# Patient Record
Sex: Female | Born: 1965 | Race: Black or African American | Hispanic: No | Marital: Single | Smoking: Former smoker
Health system: Southern US, Community
[De-identification: ages and names within clinical notes are randomized; demographics above are authoritative.]

## PROBLEM LIST (undated history)

## (undated) DIAGNOSIS — Z789 Other specified health status: Secondary | ICD-10-CM

## (undated) DIAGNOSIS — Z720 Tobacco use: Secondary | ICD-10-CM

## (undated) DIAGNOSIS — F1090 Alcohol use, unspecified, uncomplicated: Secondary | ICD-10-CM

## (undated) HISTORY — PX: BREAST CYST ASPIRATION: SHX578

---

## 2005-08-02 ENCOUNTER — Emergency Department: Payer: Self-pay | Admitting: Emergency Medicine

## 2005-11-09 ENCOUNTER — Emergency Department: Payer: Self-pay | Admitting: Emergency Medicine

## 2007-08-14 ENCOUNTER — Emergency Department: Payer: Self-pay | Admitting: Emergency Medicine

## 2007-10-08 HISTORY — PX: BREAST BIOPSY: SHX20

## 2008-04-11 ENCOUNTER — Emergency Department: Payer: Self-pay | Admitting: Emergency Medicine

## 2008-04-12 ENCOUNTER — Ambulatory Visit: Payer: Self-pay | Admitting: Emergency Medicine

## 2008-04-12 ENCOUNTER — Ambulatory Visit: Payer: Self-pay | Admitting: Obstetrics and Gynecology

## 2008-04-13 ENCOUNTER — Ambulatory Visit: Payer: Self-pay | Admitting: Emergency Medicine

## 2008-11-04 ENCOUNTER — Ambulatory Visit: Payer: Self-pay | Admitting: Obstetrics and Gynecology

## 2010-11-27 ENCOUNTER — Emergency Department: Payer: Self-pay | Admitting: Emergency Medicine

## 2011-06-18 ENCOUNTER — Emergency Department: Payer: Self-pay | Admitting: Emergency Medicine

## 2011-10-08 HISTORY — PX: BREAST BIOPSY: SHX20

## 2012-02-27 ENCOUNTER — Emergency Department: Payer: Self-pay | Admitting: Emergency Medicine

## 2012-03-19 ENCOUNTER — Ambulatory Visit: Payer: Self-pay | Admitting: Obstetrics and Gynecology

## 2012-04-21 ENCOUNTER — Ambulatory Visit: Payer: Self-pay | Admitting: Surgery

## 2012-04-23 LAB — PATHOLOGY REPORT

## 2012-08-07 ENCOUNTER — Emergency Department: Payer: Self-pay | Admitting: Emergency Medicine

## 2015-01-24 NOTE — Op Note (Signed)
PATIENT NAME:  [name], Jeanette A MR#:  [id number] DATE OF BIRTH:  [birth year]  DATE OF PROCEDURE:  04/21/2012  PREOPERATIVE DIAGNOSIS: Left subareolar abscess.   POSTOPERATIVE DIAGNOSIS: Left subareolar abscess.   PROCEDURE PERFORMED: Left breast ductal excision.   SURGEON: Claude MangesWilliam F. Jinnie Onley, M.D.   ANESTHESIA: General.   SPECIMEN: Left subareolar abscess cavity and ducts.   PROCEDURE IN DETAIL: The patient was placed supine on the operating room table and prepped and draped in the usual sterile fashion. An elliptical incision was made at the edge of the areola complex on the left breast to incorporate the draining sinus tract and take a sliver of areolar tissue and a small amount of skin just lateral to the areola. This was carried down through the skin into the subcutaneous tissue and the abscess cavity (which was lined with granulation tissue) and all of the ducts of the left breast were excised with electrocautery. A little bit of inspissated milk was then expressed through the nipple. The wound was irrigated with warm normal saline. This was suctioned clear and a subdermal interrupted 3-0 Monocryl closure was performed and the skin was reapproximated with a running subcuticular 5-0 Monocryl and suture strips. A compressive dressing was applied with fluffs and the patient's own athletic bra. The patient tolerated the procedure well. There were no complications. ____________________________ Claude MangesWilliam F. Shaune Westfall, MD wfm:slb D: 04/21/2012 09:43:17 ET    T: 04/21/2012 12:28:29 ET       JOB#: [id number] cc: Claude MangesWilliam F. Mannie Ohlin, MD, <Dictator> Claude MangesWILLIAM F Mela Perham MD ELECTRONICALLY SIGNED 04/21/2012 18:20

## 2015-02-07 ENCOUNTER — Other Ambulatory Visit: Payer: Self-pay | Admitting: Obstetrics and Gynecology

## 2015-02-07 DIAGNOSIS — Z1231 Encounter for screening mammogram for malignant neoplasm of breast: Secondary | ICD-10-CM

## 2015-06-09 ENCOUNTER — Other Ambulatory Visit: Payer: Self-pay | Admitting: Obstetrics and Gynecology

## 2015-06-09 DIAGNOSIS — N61 Mastitis without abscess: Secondary | ICD-10-CM

## 2015-06-16 ENCOUNTER — Ambulatory Visit
Admission: RE | Admit: 2015-06-16 | Discharge: 2015-06-16 | Disposition: A | Discharge status: Home / Self Care | Payer: PRIVATE HEALTH INSURANCE | Source: Ambulatory Visit | Attending: Obstetrics and Gynecology | Admitting: Obstetrics and Gynecology

## 2015-06-16 DIAGNOSIS — N61 Inflammatory disorders of breast: Secondary | ICD-10-CM | POA: Insufficient documentation

## 2015-11-30 ENCOUNTER — Observation Stay
Admission: EM | Admit: 2015-11-30 | Discharge: 2015-12-01 | Disposition: A | Discharge status: Home / Self Care | Payer: PRIVATE HEALTH INSURANCE | Attending: Internal Medicine | Admitting: Internal Medicine

## 2015-11-30 ENCOUNTER — Emergency Department: Payer: PRIVATE HEALTH INSURANCE

## 2015-11-30 ENCOUNTER — Encounter: Payer: Self-pay | Admitting: Emergency Medicine

## 2015-11-30 DIAGNOSIS — R300 Dysuria: Secondary | ICD-10-CM | POA: Diagnosis not present

## 2015-11-30 DIAGNOSIS — R109 Unspecified abdominal pain: Secondary | ICD-10-CM | POA: Diagnosis not present

## 2015-11-30 DIAGNOSIS — R509 Fever, unspecified: Secondary | ICD-10-CM | POA: Diagnosis not present

## 2015-11-30 DIAGNOSIS — N136 Pyonephrosis: Secondary | ICD-10-CM | POA: Diagnosis not present

## 2015-11-30 DIAGNOSIS — E876 Hypokalemia: Secondary | ICD-10-CM | POA: Diagnosis not present

## 2015-11-30 DIAGNOSIS — Z882 Allergy status to sulfonamides status: Secondary | ICD-10-CM | POA: Insufficient documentation

## 2015-11-30 DIAGNOSIS — B9620 Unspecified Escherichia coli [E. coli] as the cause of diseases classified elsewhere: Secondary | ICD-10-CM | POA: Insufficient documentation

## 2015-11-30 DIAGNOSIS — N390 Urinary tract infection, site not specified: Secondary | ICD-10-CM | POA: Insufficient documentation

## 2015-11-30 DIAGNOSIS — N23 Unspecified renal colic: Secondary | ICD-10-CM

## 2015-11-30 DIAGNOSIS — F17200 Nicotine dependence, unspecified, uncomplicated: Secondary | ICD-10-CM | POA: Diagnosis not present

## 2015-11-30 DIAGNOSIS — N3090 Cystitis, unspecified without hematuria: Secondary | ICD-10-CM | POA: Diagnosis present

## 2015-11-30 DIAGNOSIS — Z880 Allergy status to penicillin: Secondary | ICD-10-CM | POA: Insufficient documentation

## 2015-11-30 DIAGNOSIS — Z1629 Resistance to other single specified antibiotic: Secondary | ICD-10-CM | POA: Insufficient documentation

## 2015-11-30 DIAGNOSIS — Z881 Allergy status to other antibiotic agents status: Secondary | ICD-10-CM | POA: Diagnosis not present

## 2015-11-30 DIAGNOSIS — Z833 Family history of diabetes mellitus: Secondary | ICD-10-CM | POA: Diagnosis not present

## 2015-11-30 DIAGNOSIS — Z1611 Resistance to penicillins: Secondary | ICD-10-CM | POA: Diagnosis not present

## 2015-11-30 DIAGNOSIS — N200 Calculus of kidney: Secondary | ICD-10-CM

## 2015-11-30 LAB — COMPREHENSIVE METABOLIC PANEL
ALK PHOS: 60 U/L (ref 38–126)
ALT: 25 U/L (ref 14–54)
AST: 22 U/L (ref 15–41)
Albumin: 4 g/dL (ref 3.5–5.0)
Anion gap: 10 (ref 5–15)
BILIRUBIN TOTAL: 0.6 mg/dL (ref 0.3–1.2)
BUN: 8 mg/dL (ref 6–20)
CHLORIDE: 102 mmol/L (ref 101–111)
CO2: 28 mmol/L (ref 22–32)
Calcium: 9.1 mg/dL (ref 8.9–10.3)
Creatinine, Ser: 0.87 mg/dL (ref 0.44–1.00)
Glucose, Bld: 114 mg/dL — ABNORMAL HIGH (ref 65–99)
Potassium: 3 mmol/L — ABNORMAL LOW (ref 3.5–5.1)
Sodium: 140 mmol/L (ref 135–145)
TOTAL PROTEIN: 7.5 g/dL (ref 6.5–8.1)

## 2015-11-30 LAB — CBC
HEMATOCRIT: 37.6 % (ref 35.0–47.0)
Hemoglobin: 12.7 g/dL (ref 12.0–16.0)
MCH: 29.9 pg (ref 26.0–34.0)
MCHC: 33.7 g/dL (ref 32.0–36.0)
MCV: 88.9 fL (ref 80.0–100.0)
Platelets: 219 10*3/uL (ref 150–440)
RBC: 4.23 MIL/uL (ref 3.80–5.20)
RDW: 13.5 % (ref 11.5–14.5)
WBC: 13.7 10*3/uL — AB (ref 3.6–11.0)

## 2015-11-30 LAB — URINALYSIS COMPLETE WITH MICROSCOPIC (ARMC ONLY)
Bilirubin Urine: NEGATIVE
Glucose, UA: NEGATIVE mg/dL
KETONES UR: NEGATIVE mg/dL
NITRITE: NEGATIVE
PROTEIN: 100 mg/dL — AB
SPECIFIC GRAVITY, URINE: 1.006 (ref 1.005–1.030)
pH: 7 (ref 5.0–8.0)

## 2015-11-30 LAB — LIPASE, BLOOD: LIPASE: 22 U/L (ref 11–51)

## 2015-11-30 LAB — PREGNANCY, URINE: Preg Test, Ur: NEGATIVE

## 2015-11-30 LAB — MAGNESIUM: MAGNESIUM: 2.1 mg/dL (ref 1.7–2.4)

## 2015-11-30 MED ORDER — HYDROMORPHONE HCL 1 MG/ML IJ SOLN
INTRAMUSCULAR | Status: AC
  Administered 2015-11-30: 1 mg via INTRAVENOUS
  Filled 2015-11-30: qty 1

## 2015-11-30 MED ORDER — DEXTROSE 5 % IV SOLN
1.0000 g | Freq: Once | INTRAVENOUS | Status: AC
  Administered 2015-11-30: 1 g via INTRAVENOUS
  Filled 2015-11-30: qty 10

## 2015-11-30 MED ORDER — ALBUTEROL SULFATE (2.5 MG/3ML) 0.083% IN NEBU: 2.5000 mg | INHALATION_SOLUTION | RESPIRATORY_TRACT | Status: DC | PRN

## 2015-11-30 MED ORDER — ACETAMINOPHEN 650 MG RE SUPP: 650.0000 mg | Freq: Four times a day (QID) | RECTAL | Status: DC | PRN

## 2015-11-30 MED ORDER — PANTOPRAZOLE SODIUM 20 MG PO TBEC
20.0000 mg | DELAYED_RELEASE_TABLET | Freq: Every day | ORAL | Status: DC
  Filled 2015-11-30: qty 1

## 2015-11-30 MED ORDER — POTASSIUM CHLORIDE CRYS ER 20 MEQ PO TBCR
60.0000 meq | EXTENDED_RELEASE_TABLET | Freq: Once | ORAL | Status: AC
  Administered 2015-11-30: 60 meq via ORAL
  Filled 2015-11-30: qty 3

## 2015-11-30 MED ORDER — DEXTROSE 5 % IV SOLN
INTRAVENOUS | Status: AC
  Filled 2015-11-30: qty 10

## 2015-11-30 MED ORDER — CIPROFLOXACIN IN D5W 400 MG/200ML IV SOLN
400.0000 mg | Freq: Two times a day (BID) | INTRAVENOUS | Status: DC
  Administered 2015-11-30: 400 mg via INTRAVENOUS
  Filled 2015-11-30 (×3): qty 200

## 2015-11-30 MED ORDER — ACETAMINOPHEN 325 MG PO TABS: 650.0000 mg | ORAL_TABLET | Freq: Four times a day (QID) | ORAL | Status: DC | PRN

## 2015-11-30 MED ORDER — HYDROMORPHONE HCL 1 MG/ML IJ SOLN
1.0000 mg | Freq: Once | INTRAMUSCULAR | Status: AC
  Administered 2015-11-30: 1 mg via INTRAVENOUS

## 2015-11-30 MED ORDER — PHENTERMINE HCL 37.5 MG PO TABS: 37.5000 mg | ORAL_TABLET | Freq: Every morning | ORAL | Status: DC

## 2015-11-30 MED ORDER — NICOTINE 14 MG/24HR TD PT24
14.0000 mg | MEDICATED_PATCH | Freq: Every day | TRANSDERMAL | Status: DC
  Administered 2015-11-30: 14 mg via TRANSDERMAL
  Filled 2015-11-30: qty 1

## 2015-11-30 MED ORDER — KETOROLAC TROMETHAMINE 30 MG/ML IJ SOLN
30.0000 mg | Freq: Once | INTRAMUSCULAR | Status: AC
  Administered 2015-11-30: 30 mg via INTRAVENOUS

## 2015-11-30 MED ORDER — SODIUM CHLORIDE 0.9 % IV SOLN
INTRAVENOUS | Status: DC
Start: 2015-11-30 — End: 2015-12-01
  Administered 2015-11-30 – 2015-12-01 (×2): via INTRAVENOUS

## 2015-11-30 MED ORDER — ONDANSETRON HCL 4 MG PO TABS: 4.0000 mg | ORAL_TABLET | Freq: Four times a day (QID) | ORAL | Status: DC | PRN

## 2015-11-30 MED ORDER — ONDANSETRON HCL 4 MG/2ML IJ SOLN: 4.0000 mg | Freq: Four times a day (QID) | INTRAMUSCULAR | Status: DC | PRN

## 2015-11-30 MED ORDER — KETOROLAC TROMETHAMINE 30 MG/ML IJ SOLN
INTRAMUSCULAR | Status: AC
  Administered 2015-11-30: 30 mg via INTRAVENOUS
  Filled 2015-11-30: qty 1

## 2015-11-30 MED ORDER — PANTOPRAZOLE SODIUM 40 MG PO TBEC
40.0000 mg | DELAYED_RELEASE_TABLET | Freq: Every day | ORAL | Status: DC
  Administered 2015-11-30: 40 mg via ORAL
  Filled 2015-11-30: qty 1

## 2015-11-30 MED ORDER — HEPARIN SODIUM (PORCINE) 5000 UNIT/ML IJ SOLN
5000.0000 [IU] | Freq: Three times a day (TID) | INTRAMUSCULAR | Status: DC
  Administered 2015-11-30 – 2015-12-01 (×2): 5000 [IU] via SUBCUTANEOUS
  Filled 2015-11-30 (×2): qty 1

## 2015-11-30 NOTE — ED Notes (Addendum)
Pt transported to room 220 via stretcher by ED Tech.

## 2015-11-30 NOTE — ED Provider Notes (Signed)
[facility] Emergency Department Provider Note     Time seen: ----------------------------------------- 5:22 PM on 11/30/2015 -----------------------------------------    I have reviewed the triage vital signs and the nursing notes.   HISTORY  Chief Complaint Flank Pain    HPI Jeanette Ball is a 50 y.o. female who presents ER for right flank pain since yesterday. Patient is a pain with urination, warts some blood in her urine after wiping. She's never had this happen before, nothing makes symptoms better or worse. She denies fevers chills or other complaints.   History reviewed. No pertinent past medical history.  There are no active problems to display for this patient.   Past Surgical History  Procedure Laterality Date  . Breast biopsy Left 2013    neg  . Breast biopsy Right 2009    neg  . Breast cyst aspiration Bilateral     Allergies Bactrim; Penicillins; and Sulfa antibiotics  Social History Social History  Substance Use Topics  . Smoking status: Current Every Day Smoker  . Smokeless tobacco: None  . Alcohol Use: Yes    Review of Systems Constitutional: Negative for fever. Eyes: Negative for visual changes. ENT: Negative for sore throat. Cardiovascular: Negative for chest pain. Respiratory: Negative for shortness of breath. Gastrointestinal: Positive right flank pain Genitourinary: Positive for dysuria and urinary hesitancy Musculoskeletal: Negative for back pain. Skin: Negative for rash. Neurological: Negative for headaches, focal weakness or numbness.  10-point ROS otherwise negative.  ____________________________________________   PHYSICAL EXAM:  VITAL SIGNS: ED Triage Vitals  Enc Vitals Group     BP 11/30/15 1603 125/73 mmHg     Pulse Rate 11/30/15 1603 111     Resp 11/30/15 1603 20     Temp 11/30/15 1603 98.9 F (37.2 C)     Temp Source 11/30/15 1603 Oral     SpO2 11/30/15 1603 97 %     Weight  11/30/15 1603 190 lb (86.183 kg)     Height 11/30/15 1603  (1.753 m)     Head Cir --      Peak Flow --      Pain Score 11/30/15 1603 10     Pain Loc --      Pain Edu? --      Excl. in GC? --     Constitutional: Alert and oriented. Well appearing and in no distress. Eyes: Conjunctivae are normal. PERRL. Normal extraocular movements. ENT   Head: Normocephalic and atraumatic.   Nose: No congestion/rhinnorhea.   Mouth/Throat: Mucous membranes are moist.   Neck: No stridor. Cardiovascular: Normal rate, regular rhythm. Normal and symmetric distal pulses are present in all extremities. No murmurs, rubs, or gallops. Respiratory: Normal respiratory effort without tachypnea nor retractions. Breath sounds are clear and equal bilaterally. No wheezes/rales/rhonchi. Gastrointestinal: Right flank tenderness, normal bowel sounds. Musculoskeletal: Nontender with normal range of motion in all extremities. No joint effusions.  No lower extremity tenderness nor edema. Neurologic:  Normal speech and language. No gross focal neurologic deficits are appreciated. Speech is normal. No gait instability. Skin:  Skin is warm, dry and intact. No rash noted. Psychiatric: Mood and affect are normal. Speech and behavior are normal. Patient exhibits appropriate insight and judgment. ____________________________________________  ED COURSE:  Pertinent labs & imaging results that were available during my care of the patient were reviewed by me and considered in my medical decision making (see chart for details). Patient is in no acute distress, will obtain CT imaging and basic labs.  ____________________________________________    LABS (pertinent positives/negatives)  Labs Reviewed  COMPREHENSIVE METABOLIC PANEL - Abnormal; Notable for the following:    Potassium 3.0 (*)    Glucose, Bld 114 (*)    All other components within normal limits  CBC - Abnormal; Notable for the following:    WBC 13.7  (*)    All other components within normal limits  URINALYSIS COMPLETEWITH MICROSCOPIC ([facility] ONLY) - Abnormal; Notable for the following:    Color, Urine YELLOW (*)    APPearance CLOUDY (*)    Hgb urine dipstick 3+ (*)    Protein, ur 100 (*)    Leukocytes, UA 3+ (*)    Bacteria, UA FEW (*)    Squamous Epithelial / LPF 0-5 (*)    All other components within normal limits  URINE CULTURE  LIPASE, BLOOD  PREGNANCY, URINE    RADIOLOGY Images were viewed by me  CT renal protocol IMPRESSION: 1. There is mild right hydronephrosis as well as right perinephric stranding and mild right ureteral dilation. Although not definitive, there is a small stone just above the ureterovesicular junction on the right that appears to reside in the distal right ureter. 2. No other acute finding. No intrarenal stones. ____________________________________________  FINAL ASSESSMENT AND PLAN  Renal colic, cystitis  Plan: Patient with labs and imaging as dictated above. I have discussed with Dr. Annabell Howells who is on-call for urology. Patient is feeling better, but she has findings concerning for infection associated with a renal stone. She has received IV Rocephin, a urine culture has been sent. The neurologist has requested admission for observation. I will discuss with the hospitalist for admission.   Emily Filbert, MD   Emily Filbert, MD 11/30/15 [phone]

## 2015-11-30 NOTE — H&P (Addendum)
[facility] [place] [facility]   PATIENT NAME: Jeanette Ball    MR#:  [id number]  DATE OF BIRTH:  [birth year]  DATE OF ADMISSION:  11/30/2015  PRIMARY CARE PHYSICIAN: No PCP Per Patient   REQUESTING/REFERRING PHYSICIAN: Emily Filbert, MD  CHIEF COMPLAINT:   Chief Complaint  Patient presents with  . Flank Pain   Right abdominal pain and flank pain 2 days. HISTORY OF PRESENT ILLNESS:  Jeanette Ball  is a 50 y.o. female with no past medical history. She presented in the ED with right-sided abdominal pain and flank pain 2 days. She had fever one time yesterday. She she denies any dysuria, hematuria or urinary frequency. Urinalysis show UTI. CAT scan of abdomen and pelvis show nephrolithiasis and hydronephrosis. Urologist to suggest observation the patient and may get procedure tomorrow.  PAST MEDICAL HISTORY:  History reviewed. No pertinent past medical history.  PAST SURGICAL HISTORY:   Past Surgical History  Procedure Laterality Date  . Breast biopsy Left 2013    neg  . Breast biopsy Right 2009    neg  . Breast cyst aspiration Bilateral     SOCIAL HISTORY:   Social History  Substance Use Topics  . Smoking status: Current Every Day Smoker -- 1.00 packs/day for 7 years  . Smokeless tobacco: Not on file  . Alcohol Use: Yes    FAMILY HISTORY:  No family history on file.  DRUG ALLERGIES:   Allergies  Allergen Reactions  . Bactrim [Sulfamethoxazole-Trimethoprim]   . Penicillins Other (See Comments)    Hallucination and Skin falling off.  PT hasn't had Pen since baby and was hospitalized for it.   . Sulfa Antibiotics     REVIEW OF SYSTEMS:  CONSTITUTIONAL: No fever, fatigue or weakness.  EYES: No blurred or double vision.  EARS, NOSE, AND THROAT: No tinnitus or ear pain.  RESPIRATORY: No cough, shortness of breath, wheezing or hemoptysis.  CARDIOVASCULAR: No chest pain, orthopnea, edema.  GASTROINTESTINAL: No nausea,  vomiting, diarrhea but has right-sided flank and abdominal pain.  GENITOURINARY: No dysuria, hematuria.  ENDOCRINE: No polyuria, nocturia,  HEMATOLOGY: No anemia, easy bruising or bleeding SKIN: No rash or lesion. MUSCULOSKELETAL: No joint pain or arthritis.   NEUROLOGIC: No tingling, numbness, weakness.  PSYCHIATRY: No anxiety or depression.   MEDICATIONS AT HOME:   Prior to Admission medications   Medication Sig Start Date End Date Taking? Authorizing Provider  pantoprazole (PROTONIX) 20 MG tablet Take 1 tablet by mouth daily. 11/21/15  Yes Historical Provider, MD  phentermine (ADIPEX-P) 37.5 MG tablet Take 37.5 mg by mouth every morning. 11/18/15  Yes Historical Provider, MD      VITAL SIGNS:  Blood pressure 129/84, pulse 88, temperature 98.9 F (37.2 C), temperature source Oral, resp. rate 16, height  (1.753 m), weight 86.183 kg (190 lb), SpO2 99 %.  PHYSICAL EXAMINATION:  GENERAL:  50 y.o.-year-old patient lying in the bed with no acute distress.  EYES: Pupils equal, round, reactive to light and accommodation. No scleral icterus. Extraocular muscles intact.  HEENT: Head atraumatic, normocephalic. Oropharynx and nasopharynx clear.  NECK:  Supple, no jugular venous distention. No thyroid enlargement, no tenderness.  LUNGS: Normal breath sounds bilaterally, no wheezing, rales,rhonchi or crepitation. No use of accessory muscles of respiration.  CARDIOVASCULAR: S1, S2 normal. No murmurs, rubs, or gallops.  ABDOMEN: Soft, nontender, nondistended. Bowel sounds present. No organomegaly or mass.  EXTREMITIES: No pedal edema, cyanosis, or clubbing.  NEUROLOGIC: Cranial nerves II  through XII are intact. Muscle strength 5/5 in all extremities. Sensation intact. Gait not checked.  PSYCHIATRIC: The patient is alert and oriented x 3.  SKIN: No obvious rash, lesion, or ulcer.   LABORATORY PANEL:   CBC  Recent Labs Lab 11/30/15 1609  WBC 13.7*  HGB 12.7  HCT 37.6  PLT 219    ------------------------------------------------------------------------------------------------------------------  Chemistries   Recent Labs Lab 11/30/15 1609  NA 140  K 3.0*  CL 102  CO2 28  GLUCOSE 114*  BUN 8  CREATININE 0.87  CALCIUM 9.1  AST 22  ALT 25  ALKPHOS 60  BILITOT 0.6   ------------------------------------------------------------------------------------------------------------------  Cardiac Enzymes No results for input(s): TROPONINI in the last 168 hours. ------------------------------------------------------------------------------------------------------------------  RADIOLOGY:  Ct Renal Stone Study  11/30/2015  CLINICAL DATA:  Right flank pain since yesterday. Dysuria. Possible hematuria. EXAM: CT ABDOMEN AND PELVIS WITHOUT CONTRAST TECHNIQUE: Multidetector CT imaging of the abdomen and pelvis was performed following the standard protocol without IV contrast. COMPARISON:  None. FINDINGS: Lung bases: Minimal atelectasis. Otherwise clear. Heart normal in size. Liver, spleen, gallbladder, pancreas, adrenal glands:  Normal. Kidneys, ureters, bladder: Kidneys are under rotated. There is mild right hydronephrosis with right perinephric and periureteral stranding. There is mild dilation of the right ureter. The distal ureter is difficult to follow to the ureterovesicular junction. There appears to be a small calculus in the distal ureter just above the ureterovesicular junction. There also multiple phleboliths in the pelvis. There are no intrarenal stones. No renal masses. Left renal collecting system ureter are unremarkable. No bladder mass or stone. Uterus and adnexa: Poorly defined partly calcified fibroid. Uterus normal in overall size. No adnexal masses. Lymph nodes:  No adenopathy. Ascites:  None. Gastrointestinal:  Unremarkable.  Normal appendix visualized. Musculoskeletal: Minor disc degenerative changes along the visualized thoracic and lumbar spine. Otherwise  unremarkable. IMPRESSION: 1. There is mild right hydronephrosis as well as right perinephric stranding and mild right ureteral dilation. Although not definitive, there is a small stone just above the ureterovesicular junction on the right that appears to reside in the distal right ureter. 2. No other acute finding.  No intrarenal stones. Electronically Signed   By: Amie Portland M.D.   On: 11/30/2015 18:58    EKG:   Orders placed or performed in visit on 11/27/10  . EKG 12-Lead    IMPRESSION AND PLAN:   Nephrolithiasis Hydronephrosis UTI, pyelonephritis Leukocytosis  The patient will be placed for observation. I will start Cipro IV twice a day, follow-up BC, urine culture and the urologist's consult. Start IV fluid support, Zofran when necessary.   hypokalemia.  Give potassium supplement and check magnesium level.  Tobacco abuse. Smoking cessation was counseled for 3 minutes. Nicotine patch.  All the records are reviewed and case discussed with ED provider. Management plans discussed with the patient, family and they are in agreement.  CODE STATUS: Full code  TOTAL TIME TAKING CARE OF THIS PATIENT: 48 minutes.    Shaune Pollack M.D on 11/30/2015 at 8:19 PM  Between 7am to 6pm - Pager - [phone]  After 6pm go to [email or web address] - password [email or web address] [facility]  [facility]  Office  [phone]  CC: Primary care physician; No PCP Per Patient

## 2015-11-30 NOTE — ED Notes (Signed)
Pt reports right flank pain since yesterday; pain with urination. Pt also reports scant amount of red blood after wiping occasionally.

## 2015-11-30 NOTE — Progress Notes (Signed)
Pt chart states PNC allergy. When asked what happens when she takes [facility] she states "her skin falls off and she hallucinates." patient ordered ceftriaxone. Called Dr. Mayford Knife, informed him of reaction, states he is ok with her getting the ceftriaxone.  Olene Floss, Pharm.D Clinical Pharmacist

## 2015-11-30 NOTE — ED Notes (Signed)
Consult at bedside.

## 2015-12-01 DIAGNOSIS — N390 Urinary tract infection, site not specified: Secondary | ICD-10-CM

## 2015-12-01 DIAGNOSIS — R1031 Right lower quadrant pain: Secondary | ICD-10-CM

## 2015-12-01 DIAGNOSIS — N132 Hydronephrosis with renal and ureteral calculous obstruction: Secondary | ICD-10-CM

## 2015-12-01 LAB — CBC
HEMATOCRIT: 35.5 % (ref 35.0–47.0)
HEMOGLOBIN: 11.9 g/dL — AB (ref 12.0–16.0)
MCH: 30 pg (ref 26.0–34.0)
MCHC: 33.6 g/dL (ref 32.0–36.0)
MCV: 89.3 fL (ref 80.0–100.0)
Platelets: 201 10*3/uL (ref 150–440)
RBC: 3.98 MIL/uL (ref 3.80–5.20)
RDW: 13.3 % (ref 11.5–14.5)
WBC: 11.5 10*3/uL — AB (ref 3.6–11.0)

## 2015-12-01 LAB — BASIC METABOLIC PANEL
ANION GAP: 6 (ref 5–15)
BUN: 8 mg/dL (ref 6–20)
CO2: 27 mmol/L (ref 22–32)
Calcium: 8.3 mg/dL — ABNORMAL LOW (ref 8.9–10.3)
Chloride: 108 mmol/L (ref 101–111)
Creatinine, Ser: 0.74 mg/dL (ref 0.44–1.00)
Glucose, Bld: 96 mg/dL (ref 65–99)
POTASSIUM: 4.1 mmol/L (ref 3.5–5.1)
SODIUM: 141 mmol/L (ref 135–145)

## 2015-12-01 MED ORDER — CIPROFLOXACIN HCL 500 MG PO TABS: 500.0000 mg | ORAL_TABLET | Freq: Two times a day (BID) | ORAL | Status: DC

## 2015-12-01 MED ORDER — CIPROFLOXACIN HCL 500 MG PO TABS
500.0000 mg | ORAL_TABLET | Freq: Two times a day (BID) | ORAL | Status: DC
  Filled 2015-12-01 (×2): qty 1

## 2015-12-01 NOTE — Progress Notes (Signed)
Pt to be discharged per MD order. Iv removed. All instructions reviewed with pt and family. All questions answered.

## 2015-12-01 NOTE — Consult Note (Signed)
 @ 8:09 AM   Jeanette Ball Mar 18, 1966 [id number]  Referring provider: Dr. Eliane Decree  Chief Complaint  Patient presents with  . Flank Pain    HPI: The patient is a 50 year old otherwise healthy female with known GU history presented to hospital with right flank and lower quadrant pain as well as urinary frequency. She was diagnosed with a possible punctate right ureterovesical junction stone with mild hydronephrosis. She says her pain started approximately 2 days ago. She is also noted urinary frequency without large volume voids, so she presented to the emergency room. She has not had fevers, chills, nausea, or vomiting. She is given 1 dose of pain medication approximately 15 hours ago. She has not needed any pain medication since that time. She notes no pain at this time. She has been straining her urine however she did void a few times between the CT scan and straining of urine. She has never had a kidney stone before, but her daughter has had them in the past.     PMH: History reviewed. No pertinent past medical history.  Surgical History: Past Surgical History  Procedure Laterality Date  . Breast biopsy Left 2013    neg  . Breast biopsy Right 2009    neg  . Breast cyst aspiration Bilateral     Home Medications:    Medication List    ASK your doctor about these medications        pantoprazole 20 MG tablet  Commonly known as:  PROTONIX  Take 1 tablet by mouth daily.     phentermine 37.5 MG tablet  Commonly known as:  ADIPEX-P  Take 37.5 mg by mouth every morning.        Allergies:  Allergies  Allergen Reactions  . Bactrim [Sulfamethoxazole-Trimethoprim]   . Penicillins Other (See Comments)    Hallucination and Skin falling off.  PT hasn't had Pen since baby and was hospitalized for it.   . Sulfa Antibiotics     Family History: Family History  Problem Relation Age of Onset  . Diabetes Mother     Social History:  reports that she has been  smoking.  She does not have any smokeless tobacco history on file. She reports that she drinks alcohol. She reports that she does not use illicit drugs.  ROS: 12 point ROS otherwise negative                                        Physical Exam: BP 110/68 mmHg  Pulse 85  Temp(Src) 98.1 F (36.7 C) (Oral)  Resp 16  Ht  (1.753 m)  Wt 168 lb 4.8 oz (76.34 kg)  BMI 24.84 kg/m2  SpO2 100%  Constitutional:  Alert and oriented, No acute distress. HEENT: [place] AT, moist mucus membranes.  Trachea midline, no masses. Cardiovascular: No clubbing, cyanosis, or edema. Respiratory: Normal respiratory effort, no increased work of breathing. GI: Abdomen is soft, nontender, nondistended, no abdominal masses GU: No CVA tenderness.   Skin: No rashes, bruises or suspicious lesions. Lymph: No cervical or inguinal adenopathy. Neurologic: Grossly intact, no focal deficits, moving all 4 extremities. Psychiatric: Normal mood and affect.  Laboratory Data: Lab Results  Component Value Date   WBC 11.5* 12/01/2015   HGB 11.9* 12/01/2015   HCT 35.5 12/01/2015   MCV 89.3 12/01/2015   PLT 201 12/01/2015    Lab Results  Component  Value Date   CREATININE 0.74 12/01/2015    No results found for: PSA  No results found for: TESTOSTERONE  No results found for: HGBA1C  Urinalysis    Component Value Date/Time   COLORURINE YELLOW* 11/30/2015 1608   APPEARANCEUR CLOUDY* 11/30/2015 1608   LABSPEC 1.006 11/30/2015 1608   PHURINE 7.0 11/30/2015 1608   GLUCOSEU NEGATIVE 11/30/2015 1608   HGBUR 3+* 11/30/2015 1608   BILIRUBINUR NEGATIVE 11/30/2015 1608   KETONESUR NEGATIVE 11/30/2015 1608   PROTEINUR 100* 11/30/2015 1608   NITRITE NEGATIVE 11/30/2015 1608   LEUKOCYTESUR 3+* 11/30/2015 1608    Pertinent Imaging: CLINICAL DATA: Right flank pain since yesterday. Dysuria. Possible hematuria.  EXAM: CT ABDOMEN AND PELVIS WITHOUT CONTRAST  TECHNIQUE: Multidetector CT  imaging of the abdomen and pelvis was performed following the standard protocol without IV contrast.  COMPARISON: None.  FINDINGS: Lung bases: Minimal atelectasis. Otherwise clear. Heart normal in size.  Liver, spleen, gallbladder, pancreas, adrenal glands: Normal.  Kidneys, ureters, bladder: Kidneys are under rotated. There is mild right hydronephrosis with right perinephric and periureteral stranding. There is mild dilation of the right ureter. The distal ureter is difficult to follow to the ureterovesicular junction. There appears to be a small calculus in the distal ureter just above the ureterovesicular junction. There also multiple phleboliths in the pelvis. There are no intrarenal stones. No renal masses. Left renal collecting system ureter are unremarkable. No bladder mass or stone.  Uterus and adnexa: Poorly defined partly calcified fibroid. Uterus normal in overall size. No adnexal masses.  Lymph nodes: No adenopathy.  Ascites: None.  Gastrointestinal: Unremarkable. Normal appendix visualized.  Musculoskeletal: Minor disc degenerative changes along the visualized thoracic and lumbar spine. Otherwise unremarkable.  IMPRESSION: 1. There is mild right hydronephrosis as well as right perinephric stranding and mild right ureteral dilation. Although not definitive, there is a small stone just above the ureterovesicular junction on the right that appears to reside in the distal right ureter. 2. No other acute finding. No intrarenal stones.  Assessment & Plan:    On review of the patient's CT scan, it is unclear if she has a punctate right ureterovesical junction calculus or recently passed a stone. However, since she was admitted her symptoms which included urinary frequency and right flank pain have completely resolved. I expect that if she did have a stone that it has since passed especially given its very small size and location. The patient's white  blood cell count has normalized and her vitals are stable. I do not think there is an indication for urological intervention at this point. I did discuss with the patient in great detail that if she were to develop a fever that she would need to return to the hospital. She is agreeable to this treatment plan.   1. Possible right ureterovesical junction punctate calculus with mild hydronephrosis. -Based on symptoms, stone size, and stone location, the patient has likely passed the stone. -No indication for urological intervention at this point -Okay to resume diet -Okay to discharge home from a urological standpoint -Patient is agreeable to return to the hospital in the unlikely event that she develops a fever or her symptoms return  2. Urinary tract infection -Continue antibiotics pending culture and sensitivity results. From a urology standpoint, it is okay for her to be discharged home on ciprofloxacin with outpatient adjustment of antibiotic based on cultures and sensitivities if needed   Hildred Laser, MD  [facility] [address], [address]  [place], [place] [address] [phone]

## 2015-12-01 NOTE — Discharge Summary (Addendum)
[facility] - [place] at [facility]   PATIENT NAME: Jeanette Ball    MR#:  [id number]  DATE OF BIRTH:  03-06-1966  DATE OF ADMISSION:  11/30/2015 ADMITTING PHYSICIAN: Shaune Pollack, MD  DATE OF DISCHARGE: 11/30/15  PRIMARY CARE PHYSICIAN: No PCP Per Patient    ADMISSION DIAGNOSIS:  Renal colic [N23] Cystitis [N30.90]  DISCHARGE DIAGNOSIS:  Acute right ureteral vesicle junction stone with mild hydronephrosis-resolved UTI Tobacco use SECONDARY DIAGNOSIS:  History reviewed. No pertinent past medical history.  HOSPITAL COURSE:  50 year old otherwise healthy female with known GU history presented to hospital with right flank and lower quadrant pain as well as urinary frequency. She was diagnosed with a possible punctate right ureterovesical junction stone with mild hydronephrosis. She says her pain started approximately 2 days ago  1. acute flank pain secondary to right ureterovesical junction punctate calculus with mild hydronephrosis. -Based on symptoms, stone size, and stone location, the patient has likely passed the stone. -No indication for urological intervention at this point per Dr.BUDZYN -Okay to resume diet -Okay to discharge home from a urological standpoint  2Lower Urinary tract infectioto continue Cipro for 7 days.   3. Tobacco abuse smoking cessation advice 3 minutes spent   Patient overall stable. Discharge home.  CONSULTS OBTAINED:  Treatment Team:  Hildred Laser, MD  DRUG ALLERGIES:   Allergies  Allergen Reactions  . Bactrim [Sulfamethoxazole-Trimethoprim]   . Penicillins Other (See Comments)    Hallucination and Skin falling off.  PT hasn't had Pen since baby and was hospitalized for it.   . Sulfa Antibiotics     DISCHARGE MEDICATIONS:   Current Discharge Medication List    START taking these medications   Details  ciprofloxacin (CIPRO) 500 MG tablet Take 1 tablet (500 mg total) by mouth 2 (two) times daily. Qty:  14 tablet, Refills: 0      CONTINUE these medications which have NOT CHANGED   Details  pantoprazole (PROTONIX) 20 MG tablet Take 1 tablet by mouth daily. Refills: 0    phentermine (ADIPEX-P) 37.5 MG tablet Take 37.5 mg by mouth every morning. Refills: 0        If you experience worsening of your admission symptoms, develop shortness of breath, life threatening emergency, suicidal or homicidal thoughts you must seek medical attention immediately by calling 911 or calling your MD immediately  if symptoms less severe.  You Must read complete instructions/literature along with all the possible adverse reactions/side effects for all the Medicines you take and that have been prescribed to you. Take any new Medicines after you have completely understood and accept all the possible adverse reactions/side effects.   Please note  You were cared for by a hospitalist during your hospital stay. If you have any questions about your discharge medications or the care you received while you were in the hospital after you are discharged, you can call the unit and asked to speak with the hospitalist on call if the hospitalist that took care of you is not available. Once you are discharged, your primary care physician will handle any further medical issues. Please note that NO REFILLS for any discharge medications will be authorized once you are discharged, as it is imperative that you return to your primary care physician (or establish a relationship with a primary care physician if you do not have one) for your aftercare needs so that they can reassess your need for medications and monitor your lab values. Today   SUBJECTIVE  no complaints    VITAL SIGNS:  Blood pressure 110/68, pulse 85, temperature 98.1 F (36.7 C), temperature source Oral, resp. rate 16, height 5\' 9"  (1.753 m), weight 76.34 kg (168 lb 4.8 oz), SpO2 100 %.  I/O:   Intake/Output Summary (Last 24 hours) at 12/01/15 0934 Last data  filed at 12/01/15 0746  Gross per 24 hour  Intake 1289.58 ml  Output    850 ml  Net 439.58 ml    PHYSICAL EXAMINATION:  GENERAL:  50 y.o.-year-old patient lying in the bed with no acute distress.  EYES: Pupils equal, round, reactive to light and accommodation. No scleral icterus. Extraocular muscles intact.  HEENT: Head atraumatic, normocephalic. Oropharynx and nasopharynx clear.  NECK:  Supple, no jugular venous distention. No thyroid enlargement, no tenderness.  LUNGS: Normal breath sounds bilaterally, no wheezing, rales,rhonchi or crepitation. No use of accessory muscles of respiration.  CARDIOVASCULAR: S1, S2 normal. No murmurs, rubs, or gallops.  ABDOMEN: Soft, non-tender, non-distended. Bowel sounds present. No organomegaly or mass.  EXTREMITIES: No pedal edema, cyanosis, or clubbing.  NEUROLOGIC: Cranial nerves II through XII are intact. Muscle strength 5/5 in all extremities. Sensation intact. Gait not checked.  PSYCHIATRIC: The patient is alert and oriented x 3.  SKIN: No obvious rash, lesion, or ulcer.   DATA REVIEW:   CBC   Recent Labs Lab 12/01/15 0625  WBC 11.5*  HGB 11.9*  HCT 35.5  PLT 201    Chemistries   Recent Labs Lab 11/30/15 1608  11/30/15 1609 12/01/15 0625  NA  --   < > 140 141  K  --   < > 3.0* 4.1  CL  --   < > 102 108  CO2  --   < > 28 27  GLUCOSE  --   < > 114* 96  BUN  --   < > 8 8  CREATININE  --   < > 0.87 0.74  CALCIUM  --   < > 9.1 8.3*  MG 2.1  --   --   --   AST  --   --  22  --   ALT  --   --  25  --   ALKPHOS  --   --  60  --   BILITOT  --   --  0.6  --   < > = values in this interval not displayed.  Microbiology Results   No results found for this or any previous visit (from the past 240 hour(s)).  RADIOLOGY:  Ct Renal Stone Study  11/30/2015  CLINICAL DATA:  Right flank pain since yesterday. Dysuria. Possible hematuria. EXAM: CT ABDOMEN AND PELVIS WITHOUT CONTRAST TECHNIQUE: Multidetector CT imaging of the abdomen and  pelvis was performed following the standard protocol without IV contrast. COMPARISON:  None. FINDINGS: Lung bases: Minimal atelectasis. Otherwise clear. Heart normal in size. Liver, spleen, gallbladder, pancreas, adrenal glands:  Normal. Kidneys, ureters, bladder: Kidneys are under rotated. There is mild right hydronephrosis with right perinephric and periureteral stranding. There is mild dilation of the right ureter. The distal ureter is difficult to follow to the ureterovesicular junction. There appears to be a small calculus in the distal ureter just above the ureterovesicular junction. There also multiple phleboliths in the pelvis. There are no intrarenal stones. No renal masses. Left renal collecting system ureter are unremarkable. No bladder mass or stone. Uterus and adnexa: Poorly defined partly calcified fibroid. Uterus normal in overall size. No adnexal masses. Lymph nodes:  No adenopathy. Ascites:  None. Gastrointestinal:  Unremarkable.  Normal appendix visualized. Musculoskeletal: Minor disc degenerative changes along the visualized thoracic and lumbar spine. Otherwise unremarkable. IMPRESSION: 1. There is mild right hydronephrosis as well as right perinephric stranding and mild right ureteral dilation. Although not definitive, there is a small stone just above the ureterovesicular junction on the right that appears to reside in the distal right ureter. 2. No other acute finding.  No intrarenal stones. Electronically Signed   By: Amie Portland M.D.   On: 11/30/2015 18:58     Management plans discussed with the patient, family and they are in agreement.  CODE STATUS:     Code Status Orders        Start     Ordered   11/30/15 2059  Full code   Continuous     11/30/15 2058    Code Status History    Date Active Date Inactive Code Status Order ID Comments User Context   This patient has a current code status but no historical code status.      TOTAL TIME TAKING CARE OF THIS PATIENT: 40  minutes.    Cyan Clippinger M.D on 12/01/2015 at 9:34 AM  Between 7am to 6pm - Pager - [phone] After 6pm go to [email or web address] - password [email or web address]  [facility]  Office  [phone]  CC: Primary care physician; No PCP Per Patient

## 2015-12-03 LAB — URINE CULTURE
Culture: >=100000
SPECIAL REQUESTS: NORMAL

## 2016-05-20 ENCOUNTER — Other Ambulatory Visit: Payer: Self-pay | Admitting: Obstetrics and Gynecology

## 2016-05-20 DIAGNOSIS — Z1231 Encounter for screening mammogram for malignant neoplasm of breast: Secondary | ICD-10-CM

## 2018-05-05 ENCOUNTER — Other Ambulatory Visit: Payer: Self-pay

## 2018-05-05 ENCOUNTER — Encounter: Payer: Self-pay | Admitting: Emergency Medicine

## 2018-05-05 DIAGNOSIS — H1031 Unspecified acute conjunctivitis, right eye: Secondary | ICD-10-CM | POA: Insufficient documentation

## 2018-05-05 DIAGNOSIS — Z79899 Other long term (current) drug therapy: Secondary | ICD-10-CM | POA: Insufficient documentation

## 2018-05-05 DIAGNOSIS — H5711 Ocular pain, right eye: Secondary | ICD-10-CM | POA: Diagnosis present

## 2018-05-05 DIAGNOSIS — F17200 Nicotine dependence, unspecified, uncomplicated: Secondary | ICD-10-CM | POA: Diagnosis not present

## 2018-05-05 NOTE — ED Triage Notes (Signed)
Right eye redness/ blurry x2 days.

## 2018-05-05 NOTE — ED Notes (Signed)
Eye irritation x2 days

## 2018-05-06 ENCOUNTER — Other Ambulatory Visit: Payer: Self-pay

## 2018-05-06 ENCOUNTER — Emergency Department
Admission: EM | Admit: 2018-05-06 | Discharge: 2018-05-06 | Disposition: A | Discharge status: Home / Self Care | Payer: PRIVATE HEALTH INSURANCE | Attending: Emergency Medicine | Admitting: Emergency Medicine

## 2018-05-06 DIAGNOSIS — H5789 Other specified disorders of eye and adnexa: Secondary | ICD-10-CM

## 2018-05-06 DIAGNOSIS — H1031 Unspecified acute conjunctivitis, right eye: Secondary | ICD-10-CM

## 2018-05-06 MED ORDER — TETRACAINE HCL 0.5 % OP SOLN
OPHTHALMIC | Status: AC
  Filled 2018-05-06: qty 4

## 2018-05-06 MED ORDER — TOBRAMYCIN 0.3 % OP SOLN
2.0000 [drp] | OPHTHALMIC | Status: DC
  Administered 2018-05-06: 2 [drp] via OPHTHALMIC
  Filled 2018-05-06: qty 5

## 2018-05-06 MED ORDER — FLUORESCEIN SODIUM 1 MG OP STRP
1.0000 | ORAL_STRIP | Freq: Once | OPHTHALMIC | Status: AC
  Administered 2018-05-06: 1 via OPHTHALMIC

## 2018-05-06 MED ORDER — TETRACAINE HCL 0.5 % OP SOLN
2.0000 [drp] | Freq: Once | OPHTHALMIC | Status: AC
  Administered 2018-05-06: 2 [drp] via OPHTHALMIC

## 2018-05-06 MED ORDER — TOBRAMYCIN 0.3 % OP SOLN: 2.0000 [drp] | OPHTHALMIC | 0 refills | Status: AC

## 2018-05-06 MED ORDER — FLUORESCEIN SODIUM 1 MG OP STRP
ORAL_STRIP | OPHTHALMIC | Status: AC
  Filled 2018-05-06: qty 1

## 2018-05-06 NOTE — ED Notes (Signed)
Called pharmacy to send medication, stated he would send it down.

## 2018-05-06 NOTE — Discharge Instructions (Addendum)
1.  Apply Tobrex antibiotic eyedrops -2 drops every 4 hours to right eye while awake for the next 7 days. 2.  Return to the ER for worsening symptoms, persistent vomiting, difficult to breathing or other concerns.

## 2018-05-06 NOTE — ED Provider Notes (Signed)
[facility] Emergency Department Provider Note   ____________________________________________   None    (approximate)  I have reviewed the triage vital signs and the nursing notes.   HISTORY  Chief Complaint Eye Problem    HPI Jeanette Ball is a 52 y.o. female who presents to the ED from home with a chief complaint of right eye irritation.  Patient works at the [employer] and [employer] all day.  Has been rubbing her eyes recently.  Complains of right eye irritation/redness/blurriness for the past 2 days.  Wears glasses as needed.  Denies foreign body sensation.  Has been having some seasonal allergy issues.  Denies associated fever, chills, chest pain, shortness of breath, abdominal pain, nausea or vomiting.  Denies recent travel or trauma.   Past medical history Kidney stones  Patient Active Problem List   Diagnosis Date Noted  . Nephrolithiasis 11/30/2015    Past Surgical History:  Procedure Laterality Date  . BREAST BIOPSY Left 2013   neg  . BREAST BIOPSY Right 2009   neg  . BREAST CYST ASPIRATION Bilateral     Prior to Admission medications   Medication Sig Start Date End Date Taking? Authorizing Provider  ciprofloxacin (CIPRO) 500 MG tablet Take 1 tablet (500 mg total) by mouth 2 (two) times daily. 12/01/15   Enedina Finner, MD  pantoprazole (PROTONIX) 20 MG tablet Take 1 tablet by mouth daily. 11/21/15   [provider]  phentermine (ADIPEX-P) 37.5 MG tablet Take 37.5 mg by mouth every morning. 11/18/15   [provider]    Allergies Bactrim [sulfamethoxazole-trimethoprim]; Penicillins; and Sulfa antibiotics  Family History  Problem Relation Age of Onset  . Diabetes Mother     Social History Social History   Tobacco Use  . Smoking status: Current Every Day Smoker    Packs/day: 1.00    Years: 7.00    Pack years: 7.00  . Smokeless tobacco: Never Used  Substance Use Topics  . Alcohol use: Yes  . Drug  use: No    Review of Systems  Constitutional: No fever/chills Eyes: Positive for right eye irritation/redness/blurriness. ENT: No sore throat. Cardiovascular: Denies chest pain. Respiratory: Denies shortness of breath. Gastrointestinal: No abdominal pain.  No nausea, no vomiting.  No diarrhea.  No constipation. Genitourinary: Negative for dysuria. Musculoskeletal: Negative for back pain. Skin: Negative for rash. Neurological: Negative for headaches, focal weakness or numbness.   ____________________________________________   PHYSICAL EXAM:  VITAL SIGNS: ED Triage Vitals [05/05/18 2332]  Enc Vitals Group     BP (!) 146/85     Pulse Rate 88     Resp 18     Temp 98.1 F (36.7 C)     Temp Source Oral     SpO2 100 %     Weight 178 lb (80.7 kg)     Height 5\' 9"  (1.753 m)     Head Circumference      Peak Flow      Pain Score 0     Pain Loc      Pain Edu?      Excl. in GC?     Constitutional: Alert and oriented. Well appearing and in no acute distress. Eyes: Right medial conjunctive injected with slight exudate.  Visual acuity noted.  PERRL. EOMI. Stained with floor seen and examined under Woods lamp; no corneal abrasion noted.  Globe intact.  No hyphema noted.  Funduscopy within normal limits. Head: Atraumatic. Nose: No congestion/rhinnorhea. Mouth/Throat: Mucous membranes  are moist.  Oropharynx non-erythematous. Neck: No stridor.   Cardiovascular: Normal rate, regular rhythm. Grossly normal heart sounds.  Good peripheral circulation. Respiratory: Normal respiratory effort.  No retractions. Lungs CTAB. Gastrointestinal: Soft and nontender. No distention. No abdominal bruits. No CVA tenderness. Musculoskeletal: No lower extremity tenderness nor edema.  No joint effusions. Neurologic:  Normal speech and language. No gross focal neurologic deficits are appreciated. No gait instability. Skin:  Skin is warm, dry and intact. No rash noted. Psychiatric: Mood and affect are  normal. Speech and behavior are normal.  ____________________________________________   LABS (all labs ordered are listed, but only abnormal results are displayed)  Labs Reviewed - No data to display ____________________________________________  EKG  None ____________________________________________  RADIOLOGY  ED MD interpretation: None  Official radiology report(s): No results found.  ____________________________________________   PROCEDURES  Procedure(s) performed: None  Procedures  Critical Care performed: No  ____________________________________________   INITIAL IMPRESSION / ASSESSMENT AND PLAN / ED COURSE  As part of my medical decision making, I reviewed the following data within the electronic MEDICAL RECORD NUMBER Nursing notes reviewed and incorporated, Old chart reviewed and Notes from prior ED visits   52 year old female who presents with right conjunctivitis. Will start Tobrex antibiotic eye drops and patient will follow up with ophthalmology as needed. Strict return precautions given. Patient verbalizes understanding and agrees with plan of care.      ____________________________________________   FINAL CLINICAL IMPRESSION(S) / ED DIAGNOSES  Final diagnoses:  Acute conjunctivitis of right eye, unspecified acute conjunctivitis type  Eye irritation     ED Discharge Orders    None       Note:  This document was prepared using Dragon voice recognition software and may include unintentional dictation errors.    Irean HongSung, Carrina Schoenberger J, MD 05/06/18 0400

## 2018-06-18 ENCOUNTER — Other Ambulatory Visit: Payer: Self-pay

## 2018-06-18 ENCOUNTER — Encounter: Payer: Self-pay | Admitting: Emergency Medicine

## 2018-06-18 ENCOUNTER — Emergency Department
Admission: EM | Admit: 2018-06-18 | Discharge: 2018-06-18 | Disposition: A | Discharge status: Home / Self Care | Payer: PRIVATE HEALTH INSURANCE | Attending: Student in an Organized Health Care Education/Training Program | Admitting: Student in an Organized Health Care Education/Training Program

## 2018-06-18 DIAGNOSIS — M545 Low back pain: Secondary | ICD-10-CM | POA: Diagnosis present

## 2018-06-18 DIAGNOSIS — F17200 Nicotine dependence, unspecified, uncomplicated: Secondary | ICD-10-CM | POA: Insufficient documentation

## 2018-06-18 DIAGNOSIS — Z87442 Personal history of urinary calculi: Secondary | ICD-10-CM | POA: Insufficient documentation

## 2018-06-18 DIAGNOSIS — M5430 Sciatica, unspecified side: Secondary | ICD-10-CM | POA: Diagnosis not present

## 2018-06-18 MED ORDER — KETOROLAC TROMETHAMINE 30 MG/ML IJ SOLN
30.0000 mg | Freq: Once | INTRAMUSCULAR | Status: AC
  Administered 2018-06-18: 30 mg via INTRAMUSCULAR
  Filled 2018-06-18: qty 1

## 2018-06-18 MED ORDER — BACLOFEN 10 MG PO TABS: 10.0000 mg | ORAL_TABLET | Freq: Three times a day (TID) | ORAL | 1 refills | Status: AC

## 2018-06-18 MED ORDER — METHYLPREDNISOLONE 4 MG PO TBPK: ORAL_TABLET | ORAL | 0 refills | Status: DC

## 2018-06-18 NOTE — ED Provider Notes (Signed)
[facility] Emergency Department Provider Note  ____________________________________________   First MD Initiated Contact with Patient 06/18/18 1515     (approximate)  I have reviewed the triage vital signs and the nursing notes.   HISTORY  Chief Complaint Leg Pain and Numbness    HPI Jeanette Ball is a 52 y.o. female  C/o low back pain for 2 day, no known injury, pain is worse with movement, patient states she has numbness and tingling going down the back of her leg to her foot.  She states it starts in the right hip area and radiates down.  Denies changes in bowel/urinary habits,  Using otc meds without relief Remainder ros neg   History reviewed. No pertinent past medical history.  Patient Active Problem List   Diagnosis Date Noted  . Nephrolithiasis 11/30/2015    Past Surgical History:  Procedure Laterality Date  . BREAST BIOPSY Left 2013   neg  . BREAST BIOPSY Right 2009   neg  . BREAST CYST ASPIRATION Bilateral     Prior to Admission medications   Medication Sig Start Date End Date Taking? Authorizing Provider  baclofen (LIORESAL) 10 MG tablet Take 1 tablet (10 mg total) by mouth 3 (three) times daily. 06/18/18 06/18/19  Duilio Heritage, Roselyn Bering, PA-C  methylPREDNISolone (MEDROL DOSEPAK) 4 MG TBPK tablet Take 6 pills on day one then decrease by 1 pill each day 06/18/18   Faythe Ghee, PA-C    Allergies Bactrim [sulfamethoxazole-trimethoprim]; Penicillins; and Sulfa antibiotics  Family History  Problem Relation Age of Onset  . Diabetes Mother     Social History Social History   Tobacco Use  . Smoking status: Current Every Day Smoker    Packs/day: 1.00    Years: 7.00    Pack years: 7.00  . Smokeless tobacco: Never Used  Substance Use Topics  . Alcohol use: Yes  . Drug use: No    Review of Systems  Constitutional: No fever/chills Eyes: No visual changes. ENT: No sore throat. Respiratory: Denies cough Genitourinary:  Negative for dysuria. Musculoskeletal: Positive for back pain. Skin: Negative for rash.    ____________________________________________   PHYSICAL EXAM:  VITAL SIGNS: ED Triage Vitals  Enc Vitals Group     BP 06/18/18 1435 (!) 163/84     Pulse Rate 06/18/18 1435 90     Resp --      Temp 06/18/18 1435 98.4 F (36.9 C)     Temp Source 06/18/18 1435 Oral     SpO2 06/18/18 1435 97 %     Weight 06/18/18 1436 200 lb (90.7 kg)     Height 06/18/18 1436 5\' 9"  (1.753 m)     Head Circumference --      Peak Flow --      Pain Score 06/18/18 1439 10     Pain Loc --      Pain Edu? --      Excl. in GC? --     Constitutional: Alert and oriented. Well appearing and in no acute distress. Eyes: Conjunctivae are normal.  Head: Atraumatic. Nose: No congestion/rhinnorhea. Mouth/Throat: Mucous membranes are moist.   Neck:  supple no lymphadenopathy noted Cardiovascular: Normal rate, regular rhythm. Heart sounds are normal Respiratory: Normal respiratory effort.  No retractions, lungs c t a  AGU: deferred Musculoskeletal: FROM all extremities, warm and well perfused.  Decreased rom of back due to discomfort, lumbar spine nontender, positive Slr, 10/10 strength in great toes b/l, 10/10 strength in lower legs,  n/v intact, pain is reproduced with palpation of the right buttock, the right SI joint is tender neurologic:  Normal speech and language.  Skin:  Skin is warm, dry and intact. No rash noted. Psychiatric: Mood and affect are normal. Speech and behavior are normal.  ____________________________________________   LABS (all labs ordered are listed, but only abnormal results are displayed)  Labs Reviewed - No data to display ____________________________________________   ____________________________________________  RADIOLOGY    ____________________________________________   PROCEDURES  Procedure(s) performed: Toradol 30 mg  IM  Procedures    ____________________________________________   INITIAL IMPRESSION / ASSESSMENT AND PLAN / ED COURSE  Pertinent labs & imaging results that were available during my care of the patient were reviewed by me and considered in my medical decision making (see chart for details).   Patient is a 52 year old female presents emergency department complaining of right sided low back pain with radiation down the right leg to the foot.  She states it feels like it is numb and tingling.  She denies any known injury.  Physical exam the patient is standing to prevent the discomfort.  Pain is reproduced with palpation of the right buttock.  This does repeat the sensation of numbness and tingling into her leg and foot.  The remainder the exam is unremarkable  Explained the findings to the patient.  She has acute sciatica.  She is given an injection of Toradol 30 mg IM.  She was given a prescription for a Medrol Dosepak and muscle relaxer.  She is to apply ice to the lower back.  No lifting for the next week.  She states she understands will comply.  She will follow-up with orthopedics if not better 5 7 days.  She was discharged in stable condition.     As part of my medical decision making, I reviewed the following data within the electronic MEDICAL RECORD NUMBER Nursing notes reviewed and incorporated, Old chart reviewed, Notes from prior ED visits and [place] Controlled Substance Database  ____________________________________________   FINAL CLINICAL IMPRESSION(S) / ED DIAGNOSES  Final diagnoses:  Acute sciatica      NEW MEDICATIONS STARTED DURING THIS VISIT:  Discharge Medication List as of 06/18/2018  3:55 PM    START taking these medications   Details  baclofen (LIORESAL) 10 MG tablet Take 1 tablet (10 mg total) by mouth 3 (three) times daily., Starting Thu 06/18/2018, Until Fri 06/18/2019, Normal    methylPREDNISolone (MEDROL DOSEPAK) 4 MG TBPK tablet Take 6 pills on day one then  decrease by 1 pill each day, Normal         Note:  This document was prepared using Dragon voice recognition software and may include unintentional dictation errors.     Faythe GheeFisher, Tamiki Kuba W, PA-C 06/18/18 2310    Willy Eddyobinson, Patrick, MD 06/18/18 [phone]

## 2018-06-18 NOTE — ED Triage Notes (Signed)
Says numbness and pain to right leg from hip down--starts in butt cheek.  For 2 weeks. On an d off.

## 2018-06-18 NOTE — Discharge Instructions (Signed)
Follow-up with your regular doctor if not better in 3 to 5 days.  Take medication as prescribed.  Apply ice to the right hip.  No lifting for 1 week.  Return if worsening.

## 2018-06-18 NOTE — ED Notes (Signed)
See triage note. Presents with numbness to right leg  States pain started from right hip area and moves into leg  Denies any injury   States pain became worse over the past 1-2 days

## 2019-10-15 ENCOUNTER — Encounter: Payer: Self-pay | Admitting: *Deleted

## 2021-10-04 ENCOUNTER — Other Ambulatory Visit: Payer: Self-pay

## 2021-10-04 ENCOUNTER — Emergency Department: Payer: PRIVATE HEALTH INSURANCE

## 2021-10-04 DIAGNOSIS — Z20822 Contact with and (suspected) exposure to covid-19: Secondary | ICD-10-CM | POA: Diagnosis not present

## 2021-10-04 DIAGNOSIS — R0602 Shortness of breath: Secondary | ICD-10-CM | POA: Diagnosis present

## 2021-10-04 DIAGNOSIS — J101 Influenza due to other identified influenza virus with other respiratory manifestations: Secondary | ICD-10-CM | POA: Diagnosis not present

## 2021-10-04 LAB — COMPREHENSIVE METABOLIC PANEL
ALT: 35 U/L (ref 0–44)
AST: 38 U/L (ref 15–41)
Albumin: 4 g/dL (ref 3.5–5.0)
Alkaline Phosphatase: 67 U/L (ref 38–126)
Anion gap: 9 (ref 5–15)
BUN: 9 mg/dL (ref 6–20)
CO2: 25 mmol/L (ref 22–32)
Calcium: 8.6 mg/dL — ABNORMAL LOW (ref 8.9–10.3)
Chloride: 103 mmol/L (ref 98–111)
Creatinine, Ser: 0.82 mg/dL (ref 0.44–1.00)
GFR, Estimated: >60 mL/min (ref >60)
Glucose, Bld: 100 mg/dL — ABNORMAL HIGH (ref 70–99)
Potassium: 3 mmol/L — ABNORMAL LOW (ref 3.5–5.1)
Sodium: 137 mmol/L (ref 135–145)
Total Bilirubin: 0.8 mg/dL (ref 0.3–1.2)
Total Protein: 7.4 g/dL (ref 6.5–8.1)

## 2021-10-04 LAB — RESP PANEL BY RT-PCR (FLU A&B, COVID) ARPGX2
Influenza A by PCR: POSITIVE — AB
Influenza B by PCR: NEGATIVE
SARS Coronavirus 2 by RT PCR: NEGATIVE

## 2021-10-04 LAB — CBC WITH DIFFERENTIAL/PLATELET
Abs Immature Granulocytes: 0.09 10*3/uL — ABNORMAL HIGH (ref 0.00–0.07)
Basophils Absolute: 0.1 10*3/uL (ref 0.0–0.1)
Basophils Relative: 0 %
Eosinophils Absolute: 0.1 10*3/uL (ref 0.0–0.5)
Eosinophils Relative: 0 %
HCT: 37.4 % (ref 36.0–46.0)
Hemoglobin: 12.2 g/dL (ref 12.0–15.0)
Immature Granulocytes: 1 %
Lymphocytes Relative: 8 %
Lymphs Abs: 1.1 10*3/uL (ref 0.7–4.0)
MCH: 29.7 pg (ref 26.0–34.0)
MCHC: 32.6 g/dL (ref 30.0–36.0)
MCV: 91 fL (ref 80.0–100.0)
Monocytes Absolute: 0.7 10*3/uL (ref 0.1–1.0)
Monocytes Relative: 5 %
Neutro Abs: 11.6 10*3/uL — ABNORMAL HIGH (ref 1.7–7.7)
Neutrophils Relative %: 86 %
Platelets: 182 10*3/uL (ref 150–400)
RBC: 4.11 MIL/uL (ref 3.87–5.11)
RDW: 14.1 % (ref 11.5–15.5)
WBC: 13.5 10*3/uL — ABNORMAL HIGH (ref 4.0–10.5)
nRBC: 0 % (ref 0.0–0.2)

## 2021-10-04 MED ORDER — ACETAMINOPHEN 500 MG PO TABS
1000.0000 mg | ORAL_TABLET | Freq: Once | ORAL | Status: AC
  Administered 2021-10-04: 17:00:00 1000 mg via ORAL
  Filled 2021-10-04: qty 2

## 2021-10-04 NOTE — ED Triage Notes (Signed)
Pt states she is having difficulty breathing, headaches, cough, fever, congestion, sore throat- pt states that her grandson recently tested positive for the flu

## 2021-10-04 NOTE — ED Provider Notes (Signed)
Emergency Medicine Provider Triage Evaluation Note  Jeanette Ball , a 55 y.o. female  was evaluated in triage.  Pt complains of fever, chills, body aches, cough, shortness of breath, sore throat.  Symptoms began 1 to 2 days ago.  Grandson positive for the flu.  She has not taken any Tylenol or ibuprofen for her symptoms..  Review of Systems  Positive: Fever, chills, body aches, vomiting, sore throat, headache, shortness of breath Negative: Abdominal pain, rash, weakness  Physical Exam  BP (!) 147/107    Pulse (!) 120    Temp (!) 103.1 F (39.5 C) (Oral)    Resp (!) 26    Ht 5\' 9"  (1.753 m)    Wt 96.2 kg    SpO2 90%    BMI 31.31 kg/m  Gen:   Awake, no distress Resp:  Normal effort  MSK:   Moves extremities without difficulty  Other:    Medical Decision Making  Medically screening exam initiated at 5:33 PM.  Appropriate orders placed.  Daphene A Rubi was informed that the remainder of the evaluation will be completed by another provider, this initial triage assessment does not replace that evaluation, and the importance of remaining in the ED until their evaluation is complete.     , PA-C 10/04/21 1734    10/06/21, MD 10/04/21 [phone]

## 2021-10-05 ENCOUNTER — Emergency Department
Admission: EM | Admit: 2021-10-05 | Discharge: 2021-10-05 | Disposition: A | Discharge status: Home / Self Care | Payer: PRIVATE HEALTH INSURANCE | Attending: Emergency Medicine | Admitting: Emergency Medicine

## 2021-10-05 DIAGNOSIS — J101 Influenza due to other identified influenza virus with other respiratory manifestations: Secondary | ICD-10-CM

## 2021-10-05 LAB — MAGNESIUM: Magnesium: 2.2 mg/dL (ref 1.7–2.4)

## 2021-10-05 MED ORDER — POTASSIUM CHLORIDE CRYS ER 20 MEQ PO TBCR: 40.0000 meq | EXTENDED_RELEASE_TABLET | Freq: Once | ORAL | Status: DC

## 2021-10-05 MED ORDER — IBUPROFEN 800 MG PO TABS: 800.0000 mg | ORAL_TABLET | Freq: Once | ORAL | Status: DC

## 2021-10-05 NOTE — ED Notes (Signed)
Called for room with no answer.

## 2021-10-06 ENCOUNTER — Other Ambulatory Visit: Payer: Self-pay

## 2021-10-06 ENCOUNTER — Emergency Department: Payer: PRIVATE HEALTH INSURANCE

## 2021-10-06 ENCOUNTER — Emergency Department
Admission: EM | Admit: 2021-10-06 | Discharge: 2021-10-06 | Disposition: A | Discharge status: Home / Self Care | Payer: PRIVATE HEALTH INSURANCE | Attending: Emergency Medicine | Admitting: Emergency Medicine

## 2021-10-06 DIAGNOSIS — F17210 Nicotine dependence, cigarettes, uncomplicated: Secondary | ICD-10-CM | POA: Insufficient documentation

## 2021-10-06 DIAGNOSIS — R0602 Shortness of breath: Secondary | ICD-10-CM | POA: Diagnosis present

## 2021-10-06 DIAGNOSIS — J101 Influenza due to other identified influenza virus with other respiratory manifestations: Secondary | ICD-10-CM | POA: Diagnosis not present

## 2021-10-06 DIAGNOSIS — J9801 Acute bronchospasm: Secondary | ICD-10-CM | POA: Diagnosis not present

## 2021-10-06 LAB — CBC
HCT: 39.9 % (ref 36.0–46.0)
Hemoglobin: 13.1 g/dL (ref 12.0–15.0)
MCH: 29.5 pg (ref 26.0–34.0)
MCHC: 32.8 g/dL (ref 30.0–36.0)
MCV: 89.9 fL (ref 80.0–100.0)
Platelets: 179 10*3/uL (ref 150–400)
RBC: 4.44 MIL/uL (ref 3.87–5.11)
RDW: 14.2 % (ref 11.5–15.5)
WBC: 9.9 10*3/uL (ref 4.0–10.5)
nRBC: 0 % (ref 0.0–0.2)

## 2021-10-06 LAB — TROPONIN I (HIGH SENSITIVITY)
Troponin I (High Sensitivity): 11 ng/L (ref <18)
Troponin I (High Sensitivity): 11 ng/L (ref <18)

## 2021-10-06 LAB — BASIC METABOLIC PANEL
Anion gap: 10 (ref 5–15)
BUN: 10 mg/dL (ref 6–20)
CO2: 25 mmol/L (ref 22–32)
Calcium: 8.7 mg/dL — ABNORMAL LOW (ref 8.9–10.3)
Chloride: 104 mmol/L (ref 98–111)
Creatinine, Ser: 0.79 mg/dL (ref 0.44–1.00)
GFR, Estimated: >60 mL/min (ref >60)
Glucose, Bld: 105 mg/dL — ABNORMAL HIGH (ref 70–99)
Potassium: 3.3 mmol/L — ABNORMAL LOW (ref 3.5–5.1)
Sodium: 139 mmol/L (ref 135–145)

## 2021-10-06 MED ORDER — PREDNISONE 50 MG PO TABS: 50.0000 mg | ORAL_TABLET | Freq: Every day | ORAL | 0 refills | Status: DC

## 2021-10-06 MED ORDER — ALBUTEROL SULFATE HFA 108 (90 BASE) MCG/ACT IN AERS: 2.0000 | INHALATION_SPRAY | Freq: Four times a day (QID) | RESPIRATORY_TRACT | 2 refills | Status: DC | PRN

## 2021-10-06 NOTE — ED Provider Notes (Signed)
[facility] Emergency Department Provider Note   ____________________________________________    I have reviewed the triage vital signs and the nursing notes.   HISTORY  Chief Complaint Chest tightness, cough     HPI Jeanette Ball is a 55 y.o. female who presents with complaints of chest tightness, cough, mild shortness of breath.  Patient reports some fatigue as well as body aches.  Was seen here 2 days ago but had to leave because she had to take care of her mother.  Reviewed medical records from visit from 2 days ago and noted positive influenza a PCR, patient was unaware of this.  Patient reports she does smoke cigarettes  History reviewed. No pertinent past medical history.  Patient Active Problem List   Diagnosis Date Noted   Nephrolithiasis 11/30/2015    Past Surgical History:  Procedure Laterality Date   BREAST BIOPSY Left 2013   neg   BREAST BIOPSY Right 2009   neg   BREAST CYST ASPIRATION Bilateral     Prior to Admission medications   Medication Sig Start Date End Date Taking? Authorizing Provider  albuterol (VENTOLIN HFA) 108 (90 Base) MCG/ACT inhaler Inhale 2 puffs into the lungs every 6 (six) hours as needed for wheezing or shortness of breath. 10/06/21  Yes Jene Every, MD  predniSONE (DELTASONE) 50 MG tablet Take 1 tablet (50 mg total) by mouth daily with breakfast. 10/06/21  Yes Jene Every, MD  methylPREDNISolone (MEDROL DOSEPAK) 4 MG TBPK tablet Take 6 pills on day one then decrease by 1 pill each day 06/18/18   Faythe Ghee, PA-C     Allergies Bactrim [sulfamethoxazole-trimethoprim], Penicillins, and Sulfa antibiotics  Family History  Problem Relation Age of Onset   Diabetes Mother     Social History Social History   Tobacco Use   Smoking status: Every Day    Packs/day: 1.00    Years: 7.00    Pack years: 7.00    Types: Cigarettes   Smokeless tobacco: Never  Substance Use Topics   Alcohol use:  Yes   Drug use: No    Review of Systems  Constitutional: No fever/chills Eyes: No visual changes.  ENT: No sore throat. Cardiovascular: Denies chest pain. Respiratory: Denies shortness of breath. Gastrointestinal: No abdominal pain.  No nausea, no vomiting.   Genitourinary: Negative for dysuria. Musculoskeletal: Negative for back pain. Skin: Negative for rash. Neurological: Negative for headaches or weakness   ____________________________________________   PHYSICAL EXAM:  VITAL SIGNS: ED Triage Vitals  Enc Vitals Group     BP 10/06/21 0858 (!) 139/93     Pulse Rate 10/06/21 0858 94     Resp 10/06/21 0858 18     Temp 10/06/21 0858 99.3 F (37.4 C)     Temp Source 10/06/21 0858 Oral     SpO2 10/06/21 0858 93 %     Weight 10/06/21 0856 96.2 kg (212 lb 1.3 oz)     Height 10/06/21 0856 1.753 m (5\' 9" )     Head Circumference --      Peak Flow --      Pain Score 10/06/21 0856 10     Pain Loc --      Pain Edu? --      Excl. in GC? --     Constitutional: Alert and oriented. No acute distress. Pleasant and interactive  Nose: No congestion/rhinnorhea. Mouth/Throat: Mucous membranes are moist.   Neck:  Painless ROM Cardiovascular: Normal rate, regular rhythm. Grossly normal  heart sounds.  Good peripheral circulation. Respiratory: Normal respiratory effort.  No retractions.  Scattered mild wheezes Gastrointestinal: Soft and nontender. No distention.  No CVA tenderness. Genitourinary: deferred Musculoskeletal: No lower extremity tenderness nor edema.  Warm and well perfused Neurologic:  Normal speech and language. No gross focal neurologic deficits are appreciated.  Skin:  Skin is warm, dry and intact. No rash noted. Psychiatric: Mood and affect are normal. Speech and behavior are normal.  ____________________________________________   LABS (all labs ordered are listed, but only abnormal results are displayed)  Labs Reviewed  BASIC METABOLIC PANEL - Abnormal; Notable  for the following components:      Result Value   Potassium 3.3 (*)    Glucose, Bld 105 (*)    Calcium 8.7 (*)    All other components within normal limits  CBC  TROPONIN I (HIGH SENSITIVITY)  TROPONIN I (HIGH SENSITIVITY)   ____________________________________________  EKG  ED ECG REPORT I, Jene Every, the attending physician, personally viewed and interpreted this ECG.  Date: 10/06/2021  Rhythm: normal sinus rhythm QRS Axis: normal Intervals: normal ST/T Wave abnormalities: Nonspecific ST changes Narrative Interpretation: no evidence of acute ischemia  ____________________________________________  RADIOLOGY  Chest x-ray reviewed by me, no acute abnormality ____________________________________________   PROCEDURES  Procedure(s) performed: No  Procedures   Critical Care performed: No ____________________________________________   INITIAL IMPRESSION / ASSESSMENT AND PLAN / ED COURSE  Pertinent labs & imaging results that were available during my care of the patient were reviewed by me and considered in my medical decision making (see chart for details).   Patient well-appearing and in no acute distress, vital signs are reassuring.  She has scattered wheezes on exam, suspect mild bronchospasm in the setting of influenza A.  Will treat with steroids, albuterol, she is outside of the window for Tamiflu.  Recommend supportive care.  Lab work is reviewed, no change in high sensitive troponin, CBC is normal     ____________________________________________   FINAL CLINICAL IMPRESSION(S) / ED DIAGNOSES  Final diagnoses:  Influenza A  Bronchospasm        Note:  This document was prepared using Dragon voice recognition software and may include unintentional dictation errors.    Jene Every, MD 10/06/21 [phone]

## 2021-10-06 NOTE — ED Triage Notes (Signed)
Pt here for cp and shortness of breath- pt states her whole chest hurts- pt tested positive for the flu 2 days ago- pt was seen here 2 days ago for same and LWBS

## 2021-10-06 NOTE — ED Provider Notes (Signed)
[facility] Emergency Department Provider Note   ____________________________________________    I have reviewed the triage vital signs and the nursing notes.   HISTORY  Chief Complaint Chest Pain and Shortness of Breath     HPI Jeanette Ball is a 55 y.o. female presents with complaints of cough, some chest tightness and occasional shortness of breath with exertion.  Patient reports she was seen here 2 days ago but could not stay because she had to take care of her mom who needed dialysis.  She reports she has had continued cough over the last several days which does not seem to be improving.  She she reports she typically does smoke cigarettes however has not been smoking while coughing.  No fevers reported but some chills.  History reviewed. No pertinent past medical history.  Patient Active Problem List   Diagnosis Date Noted   Nephrolithiasis 11/30/2015    Past Surgical History:  Procedure Laterality Date   BREAST BIOPSY Left 2013   neg   BREAST BIOPSY Right 2009   neg   BREAST CYST ASPIRATION Bilateral     Prior to Admission medications   Medication Sig Start Date End Date Taking? Authorizing Provider  albuterol (VENTOLIN HFA) 108 (90 Base) MCG/ACT inhaler Inhale 2 puffs into the lungs every 6 (six) hours as needed for wheezing or shortness of breath. 10/06/21  Yes Jene Every, MD  predniSONE (DELTASONE) 50 MG tablet Take 1 tablet (50 mg total) by mouth daily with breakfast. 10/06/21  Yes Jene Every, MD  methylPREDNISolone (MEDROL DOSEPAK) 4 MG TBPK tablet Take 6 pills on day one then decrease by 1 pill each day 06/18/18   Faythe Ghee, PA-C     Allergies Bactrim [sulfamethoxazole-trimethoprim], Penicillins, and Sulfa antibiotics  Family History  Problem Relation Age of Onset   Diabetes Mother     Social History Social History   Tobacco Use   Smoking status: Every Day    Packs/day: 1.00    Years: 7.00    Pack  years: 7.00    Types: Cigarettes   Smokeless tobacco: Never  Substance Use Topics   Alcohol use: Yes   Drug use: No    Review of Systems  Constitutional: Chills Eyes: No visual changes.  ENT: No sore throat. Cardiovascular: As above Respiratory: As above, cough Gastrointestinal: No abdominal pain.  No nausea, no vomiting.   Genitourinary: Negative for dysuria. Musculoskeletal: Negative for back pain. Skin: Negative for rash. Neurological: Negative for headaches or weakness   ____________________________________________   PHYSICAL EXAM:  VITAL SIGNS: ED Triage Vitals  Enc Vitals Group     BP 10/06/21 0858 (!) 139/93     Pulse Rate 10/06/21 0858 94     Resp 10/06/21 0858 18     Temp 10/06/21 0858 99.3 F (37.4 C)     Temp Source 10/06/21 0858 Oral     SpO2 10/06/21 0858 93 %     Weight 10/06/21 0856 96.2 kg (212 lb 1.3 oz)     Height 10/06/21 0856 1.753 m (5\' 9" )     Head Circumference --      Peak Flow --      Pain Score 10/06/21 0856 10     Pain Loc --      Pain Edu? --      Excl. in GC? --     Constitutional: Alert and oriented.  Eyes: Conjunctivae are normal.   Nose: No congestion/rhinnorhea. Mouth/Throat: Mucous membranes are  moist.   Neck:  Painless ROM Cardiovascular: Normal rate, regular rhythm. Grossly normal heart sounds.  Good peripheral circulation. Respiratory: Normal respiratory effort.  No retractions.  Scattered wheezes Gastrointestinal: Soft and nontender.   Musculoskeletal: No lower extremity tenderness nor edema.  Warm and well perfused Neurologic:  Normal speech and language. No gross focal neurologic deficits are appreciated.  Skin:  Skin is warm, dry and intact. No rash noted. Psychiatric: Mood and affect are normal. Speech and behavior are normal.  ____________________________________________   LABS (all labs ordered are listed, but only abnormal results are displayed)  Labs Reviewed  BASIC METABOLIC PANEL - Abnormal; Notable  for the following components:      Result Value   Potassium 3.3 (*)    Glucose, Bld 105 (*)    Calcium 8.7 (*)    All other components within normal limits  CBC  TROPONIN I (HIGH SENSITIVITY)  TROPONIN I (HIGH SENSITIVITY)   ____________________________________________  EKG  ED ECG REPORT I, Jene Every, the attending physician, personally viewed and interpreted this ECG.  Date: 10/06/2021  Rhythm: normal sinus rhythm QRS Axis: normal Intervals: normal ST/T Wave abnormalities: Nonspecific changes Narrative Interpretation: no evidence of acute ischemia  ____________________________________________  RADIOLOGY  Chest x-ray reviewed by me, no infiltrate or pneumonia ____________________________________________   PROCEDURES  Procedure(s) performed: No  Procedures   Critical Care performed: No ____________________________________________   INITIAL IMPRESSION / ASSESSMENT AND PLAN / ED COURSE  Pertinent labs & imaging results that were available during my care of the patient were reviewed by me and considered in my medical decision making (see chart for details).   Patient presents with primary complaint of cough, also some mild shortness of breath and chest tightness.  She is a smoker.  Review of past medical records demonstrates the patient tested positive for influenza A on the 29th, she was unaware of this.  Is out of window for Tamiflu.  She does have some wheezing on exam consistent with bronchospasm this is likely the cause of her chest tightness and mild shortness of breath  We will start the patient on prednisone, albuterol, outpatient follow-up as needed, return precautions discussed.    ____________________________________________   FINAL CLINICAL IMPRESSION(S) / ED DIAGNOSES  Final diagnoses:  Influenza A  Bronchospasm        Note:  This document was prepared using Dragon voice recognition software and may include unintentional dictation  errors.    Jene Every, MD 10/06/21 [phone]

## 2022-09-22 ENCOUNTER — Emergency Department: Payer: PRIVATE HEALTH INSURANCE

## 2022-09-22 ENCOUNTER — Inpatient Hospital Stay
Admission: EM | Admit: 2022-09-22 | Discharge: 2022-09-24 | DRG: 322 | Disposition: A | Discharge status: Home / Self Care | Payer: PRIVATE HEALTH INSURANCE | Attending: Internal Medicine | Admitting: Internal Medicine

## 2022-09-22 ENCOUNTER — Other Ambulatory Visit: Payer: Self-pay

## 2022-09-22 ENCOUNTER — Encounter: Payer: Self-pay | Admitting: Internal Medicine

## 2022-09-22 DIAGNOSIS — I25110 Atherosclerotic heart disease of native coronary artery with unstable angina pectoris: Secondary | ICD-10-CM | POA: Diagnosis present

## 2022-09-22 DIAGNOSIS — E669 Obesity, unspecified: Secondary | ICD-10-CM | POA: Diagnosis present

## 2022-09-22 DIAGNOSIS — I259 Chronic ischemic heart disease, unspecified: Secondary | ICD-10-CM

## 2022-09-22 DIAGNOSIS — R079 Chest pain, unspecified: Secondary | ICD-10-CM

## 2022-09-22 DIAGNOSIS — Z880 Allergy status to penicillin: Secondary | ICD-10-CM

## 2022-09-22 DIAGNOSIS — Z6831 Body mass index (BMI) 31.0-31.9, adult: Secondary | ICD-10-CM

## 2022-09-22 DIAGNOSIS — F1010 Alcohol abuse, uncomplicated: Secondary | ICD-10-CM | POA: Diagnosis present

## 2022-09-22 DIAGNOSIS — Z888 Allergy status to other drugs, medicaments and biological substances status: Secondary | ICD-10-CM

## 2022-09-22 DIAGNOSIS — I214 Non-ST elevation (NSTEMI) myocardial infarction: Principal | ICD-10-CM | POA: Diagnosis present

## 2022-09-22 DIAGNOSIS — I10 Essential (primary) hypertension: Secondary | ICD-10-CM | POA: Diagnosis present

## 2022-09-22 DIAGNOSIS — E876 Hypokalemia: Secondary | ICD-10-CM | POA: Diagnosis not present

## 2022-09-22 DIAGNOSIS — Z79899 Other long term (current) drug therapy: Secondary | ICD-10-CM | POA: Diagnosis not present

## 2022-09-22 DIAGNOSIS — Z720 Tobacco use: Secondary | ICD-10-CM

## 2022-09-22 DIAGNOSIS — F064 Anxiety disorder due to known physiological condition: Secondary | ICD-10-CM | POA: Diagnosis present

## 2022-09-22 DIAGNOSIS — F17210 Nicotine dependence, cigarettes, uncomplicated: Secondary | ICD-10-CM | POA: Diagnosis present

## 2022-09-22 DIAGNOSIS — R911 Solitary pulmonary nodule: Secondary | ICD-10-CM | POA: Diagnosis present

## 2022-09-22 DIAGNOSIS — D72829 Elevated white blood cell count, unspecified: Secondary | ICD-10-CM | POA: Insufficient documentation

## 2022-09-22 DIAGNOSIS — Z833 Family history of diabetes mellitus: Secondary | ICD-10-CM | POA: Diagnosis not present

## 2022-09-22 DIAGNOSIS — Z882 Allergy status to sulfonamides status: Secondary | ICD-10-CM

## 2022-09-22 HISTORY — DX: Tobacco use: Z72.0

## 2022-09-22 HISTORY — DX: Alcohol use, unspecified, uncomplicated: F10.90

## 2022-09-22 HISTORY — DX: Other specified health status: Z78.9

## 2022-09-22 LAB — TROPONIN I (HIGH SENSITIVITY)
Troponin I (High Sensitivity): 2338 ng/L (ref <18)
Troponin I (High Sensitivity): 2747 ng/L (ref <18)
Troponin I (High Sensitivity): 3489 ng/L (ref <18)
Troponin I (High Sensitivity): 4492 ng/L (ref <18)
Troponin I (High Sensitivity): 4577 ng/L (ref <18)

## 2022-09-22 LAB — BASIC METABOLIC PANEL
Anion gap: 11 (ref 5–15)
BUN: 9 mg/dL (ref 6–20)
CO2: 27 mmol/L (ref 22–32)
Calcium: 9.3 mg/dL (ref 8.9–10.3)
Chloride: 102 mmol/L (ref 98–111)
Creatinine, Ser: 0.73 mg/dL (ref 0.44–1.00)
GFR, Estimated: >60 mL/min (ref >60)
Glucose, Bld: 108 mg/dL — ABNORMAL HIGH (ref 70–99)
Potassium: 3.6 mmol/L (ref 3.5–5.1)
Sodium: 140 mmol/L (ref 135–145)

## 2022-09-22 LAB — PHOSPHORUS: Phosphorus: 4.5 mg/dL (ref 2.5–4.6)

## 2022-09-22 LAB — CBC
HCT: 42.2 % (ref 36.0–46.0)
Hemoglobin: 13.6 g/dL (ref 12.0–15.0)
MCH: 29.4 pg (ref 26.0–34.0)
MCHC: 32.2 g/dL (ref 30.0–36.0)
MCV: 91.3 fL (ref 80.0–100.0)
Platelets: 264 10*3/uL (ref 150–400)
RBC: 4.62 MIL/uL (ref 3.87–5.11)
RDW: 14.2 % (ref 11.5–15.5)
WBC: 13.6 10*3/uL — ABNORMAL HIGH (ref 4.0–10.5)
nRBC: 0 % (ref 0.0–0.2)

## 2022-09-22 LAB — MAGNESIUM: Magnesium: 2.2 mg/dL (ref 1.7–2.4)

## 2022-09-22 LAB — PROTIME-INR
INR: 1.1 (ref 0.8–1.2)
Prothrombin Time: 14.1 seconds (ref 11.4–15.2)

## 2022-09-22 LAB — APTT: aPTT: 149 seconds — ABNORMAL HIGH (ref 24–36)

## 2022-09-22 LAB — HEPARIN LEVEL (UNFRACTIONATED): Heparin Unfractionated: 0.19 IU/mL — ABNORMAL LOW (ref 0.30–0.70)

## 2022-09-22 MED ORDER — SENNOSIDES-DOCUSATE SODIUM 8.6-50 MG PO TABS: 1.0000 | ORAL_TABLET | Freq: Every evening | ORAL | Status: DC | PRN

## 2022-09-22 MED ORDER — LORAZEPAM 1 MG PO TABS: 1.0000 mg | ORAL_TABLET | ORAL | Status: DC | PRN

## 2022-09-22 MED ORDER — ACETAMINOPHEN 325 MG PO TABS: 650.0000 mg | ORAL_TABLET | Freq: Four times a day (QID) | ORAL | Status: DC | PRN

## 2022-09-22 MED ORDER — LORAZEPAM 2 MG/ML IJ SOLN: 0.5000 mg | Freq: Four times a day (QID) | INTRAMUSCULAR | Status: DC | PRN

## 2022-09-22 MED ORDER — THIAMINE HCL 100 MG/ML IJ SOLN: 100.0000 mg | Freq: Every day | INTRAMUSCULAR | Status: DC

## 2022-09-22 MED ORDER — SODIUM CHLORIDE 0.9% FLUSH
3.0000 mL | Freq: Two times a day (BID) | INTRAVENOUS | Status: DC
  Administered 2022-09-22 – 2022-09-24 (×4): 3 mL via INTRAVENOUS

## 2022-09-22 MED ORDER — ASPIRIN 81 MG PO TBEC
81.0000 mg | DELAYED_RELEASE_TABLET | Freq: Every day | ORAL | Status: DC
  Administered 2022-09-22 – 2022-09-23 (×2): 81 mg via ORAL
  Filled 2022-09-22 (×2): qty 1

## 2022-09-22 MED ORDER — ONDANSETRON HCL 4 MG PO TABS: 4.0000 mg | ORAL_TABLET | Freq: Four times a day (QID) | ORAL | Status: DC | PRN

## 2022-09-22 MED ORDER — IOHEXOL 350 MG/ML SOLN
100.0000 mL | Freq: Once | INTRAVENOUS | Status: AC | PRN
  Administered 2022-09-22: 100 mL via INTRAVENOUS

## 2022-09-22 MED ORDER — HEPARIN (PORCINE) 25000 UT/250ML-% IV SOLN
1450.0000 [IU]/h | INTRAVENOUS | Status: DC
  Administered 2022-09-22: 1200 [IU]/h via INTRAVENOUS
  Filled 2022-09-22 (×2): qty 250

## 2022-09-22 MED ORDER — LOSARTAN POTASSIUM 50 MG PO TABS
50.0000 mg | ORAL_TABLET | Freq: Every day | ORAL | Status: DC
  Administered 2022-09-23 – 2022-09-24 (×2): 50 mg via ORAL
  Filled 2022-09-22 (×2): qty 1

## 2022-09-22 MED ORDER — NITROGLYCERIN 0.4 MG SL SUBL
0.4000 mg | SUBLINGUAL_TABLET | SUBLINGUAL | Status: DC | PRN
  Filled 2022-09-22: qty 1

## 2022-09-22 MED ORDER — ONDANSETRON HCL 4 MG/2ML IJ SOLN: 4.0000 mg | Freq: Four times a day (QID) | INTRAMUSCULAR | Status: DC | PRN

## 2022-09-22 MED ORDER — ACETAMINOPHEN 650 MG RE SUPP: 650.0000 mg | Freq: Four times a day (QID) | RECTAL | Status: DC | PRN

## 2022-09-22 MED ORDER — HEPARIN BOLUS VIA INFUSION
4000.0000 [IU] | Freq: Once | INTRAVENOUS | Status: AC
  Administered 2022-09-22: 4000 [IU] via INTRAVENOUS
  Filled 2022-09-22: qty 4000

## 2022-09-22 MED ORDER — HEPARIN BOLUS VIA INFUSION
2600.0000 [IU] | Freq: Once | INTRAVENOUS | Status: AC
  Administered 2022-09-22: 2600 [IU] via INTRAVENOUS
  Filled 2022-09-22: qty 2600

## 2022-09-22 MED ORDER — SODIUM CHLORIDE 0.9 % IV BOLUS
1000.0000 mL | Freq: Once | INTRAVENOUS | Status: AC
  Administered 2022-09-22: 1000 mL via INTRAVENOUS

## 2022-09-22 MED ORDER — THIAMINE MONONITRATE 100 MG PO TABS
100.0000 mg | ORAL_TABLET | Freq: Every day | ORAL | Status: DC
  Administered 2022-09-22 – 2022-09-24 (×3): 100 mg via ORAL
  Filled 2022-09-22 (×3): qty 1

## 2022-09-22 MED ORDER — ROSUVASTATIN CALCIUM 10 MG PO TABS
40.0000 mg | ORAL_TABLET | Freq: Every day | ORAL | Status: DC
  Administered 2022-09-22 – 2022-09-24 (×3): 40 mg via ORAL
  Filled 2022-09-22 (×3): qty 2
  Filled 2022-09-22: qty 4

## 2022-09-22 MED ORDER — ASPIRIN 81 MG PO CHEW
324.0000 mg | CHEWABLE_TABLET | Freq: Once | ORAL | Status: DC
  Filled 2022-09-22: qty 4

## 2022-09-22 MED ORDER — NICOTINE 21 MG/24HR TD PT24: 21.0000 mg | MEDICATED_PATCH | Freq: Every day | TRANSDERMAL | Status: DC | PRN

## 2022-09-22 MED ORDER — NITROGLYCERIN 2 % TD OINT
1.0000 [in_us] | TOPICAL_OINTMENT | Freq: Four times a day (QID) | TRANSDERMAL | Status: AC | PRN
  Administered 2022-09-22: 1 [in_us] via TOPICAL
  Filled 2022-09-22: qty 1

## 2022-09-22 MED ORDER — FOLIC ACID 1 MG PO TABS
1.0000 mg | ORAL_TABLET | Freq: Every day | ORAL | Status: DC
  Administered 2022-09-22 – 2022-09-24 (×3): 1 mg via ORAL
  Filled 2022-09-22 (×3): qty 1

## 2022-09-22 MED ORDER — METOPROLOL TARTRATE 25 MG PO TABS
25.0000 mg | ORAL_TABLET | Freq: Two times a day (BID) | ORAL | Status: DC
  Administered 2022-09-22 – 2022-09-24 (×4): 25 mg via ORAL
  Filled 2022-09-22 (×4): qty 1

## 2022-09-22 MED ORDER — LORAZEPAM 2 MG/ML IJ SOLN
1.0000 mg | INTRAMUSCULAR | Status: DC | PRN
  Administered 2022-09-23: 2 mg via INTRAVENOUS
  Filled 2022-09-22: qty 1

## 2022-09-22 MED ORDER — ADULT MULTIVITAMIN W/MINERALS CH
1.0000 | ORAL_TABLET | Freq: Every day | ORAL | Status: DC
  Administered 2022-09-22 – 2022-09-24 (×3): 1 via ORAL
  Filled 2022-09-22 (×3): qty 1

## 2022-09-22 NOTE — Consult Note (Signed)
CARDIOLOGY CONSULT NOTE               Patient ID: Jeanette Ball MRN: [id number] DOB/AGE: 1966/04/26 56 y.o.  Admit date: 09/22/2022 Referring Physician Dr. Rupert Stacks hospitalist Primary Physician [facility] Primary Cardiologist  Reason for Consultation non-STEMI unstable angina  HPI: Patient is a 56 year old female smoker hypertension obesity been having significant unstable angina chest discomfort over the last several weeks she thought it was indigestion so did not seek medical attention.  The pain became severe so she finally came into the emergency room for evaluation with discomfort radiating to her left arm.  In the emergency room EKG was unremarkable but troponins were elevated so patient was admitted for probable non-STEMI placed on anticoagulation nitrates pain control and advised for further cardiac workup  Review of systems complete and found to be negative unless listed above     Past Medical History:  Diagnosis Date   Alcohol use    Tobacco use     Past Surgical History:  Procedure Laterality Date   BREAST BIOPSY Left 2013   neg   BREAST BIOPSY Right 2009   neg   BREAST CYST ASPIRATION Bilateral     (Not in a hospital admission)  Social History   Socioeconomic History   Marital status: Single    Spouse name: Not on file   Number of children: Not on file   Years of education: Not on file   Highest education level: Not on file  Occupational History   Not on file  Tobacco Use   Smoking status: Every Day    Packs/day: 1.00    Years: 7.00    Total pack years: 7.00    Types: Cigarettes   Smokeless tobacco: Never  Substance and Sexual Activity   Alcohol use: Yes   Drug use: No   Sexual activity: Not Currently  Other Topics Concern   Not on file  Social History Narrative   Not on file   Social Determinants of Health   Financial Resource Strain: Not on file  Food Insecurity: Not on file  Transportation Needs: Not on file  Physical  Activity: Not on file  Stress: Not on file  Social Connections: Not on file  Intimate Partner Violence: Not on file    Family History  Problem Relation Age of Onset   Diabetes Mother       Review of systems complete and found to be negative unless listed above      PHYSICAL EXAM  General: Well developed, well nourished, in no acute distress HEENT:  Normocephalic and atramatic Neck:  No JVD.  Lungs: Clear bilaterally to auscultation and percussion. Heart: HRRR . Normal S1 and S2 without gallops or murmurs.  Abdomen: Bowel sounds are positive, abdomen soft and non-tender  Msk:  Back normal, normal gait. Normal strength and tone for age. Extremities: No clubbing, cyanosis or edema.   Neuro: Alert and oriented X 3. Psych:  Good affect, responds appropriately  Labs:   Lab Results  Component Value Date   WBC 13.6 (H) 09/22/2022   HGB 13.6 09/22/2022   HCT 42.2 09/22/2022   MCV 91.3 09/22/2022   PLT 264 09/22/2022    Recent Labs  Lab 09/22/22 1009  NA 140  K 3.6  CL 102  CO2 27  BUN 9  CREATININE 0.73  CALCIUM 9.3  GLUCOSE 108*   No results found for: "CKTOTAL", "CKMB", "CKMBINDEX", "TROPONINI" No results found for: "CHOL" No results found for: "  HDL" No results found for: "LDLCALC" No results found for: "TRIG" No results found for: "CHOLHDL" No results found for: "LDLDIRECT"    Radiology: CT Angio Chest PE W/Cm &/Or Wo Cm  Result Date: 09/22/2022 CLINICAL DATA:  Chest pain EXAM: CT ANGIOGRAPHY CHEST WITH CONTRAST TECHNIQUE: Multidetector CT imaging of the chest was performed using the standard protocol during bolus administration of intravenous contrast. Multiplanar CT image reconstructions and MIPs were obtained to evaluate the vascular anatomy. RADIATION DOSE REDUCTION: This exam was performed according to the departmental dose-optimization program which includes automated exposure control, adjustment of the mA and/or kV according to patient size and/or use of  iterative reconstruction technique. CONTRAST:  140mL OMNIPAQUE IOHEXOL 350 MG/ML SOLN COMPARISON:  Chest radiographs including the study done earlier today FINDINGS: Cardiovascular: There is homogeneous enhancement in thoracic aorta. There are no intraluminal filling defects in pulmonary artery branches. Mediastinum/Nodes: No significant lymphadenopathy seen. Lungs/Pleura: There is no focal pulmonary consolidation. Small linear densities are seen in right middle lobe and left lower lobe. In image 83 of series 5, there is 7 mm nodular density in the anterior left lower lobe. There is paucity of lung markings in the anterior left lower lobe, possibly focal emphysema. In image 25, there is focal area of bronchial ectasia in medial left upper lobe. There is no pleural effusion or pneumothorax. Upper Abdomen: No acute findings are seen in upper abdomen. Musculoskeletal: No acute findings are seen. Review of the MIP images confirms the above findings. IMPRESSION: There is no evidence of pulmonary embolism. There is no evidence of thoracic aortic dissection. There is no focal pulmonary consolidation. There is 7 mm nodular density in left lower lobe which may be part of subsegmental atelectasis or scarring or lung nodule such as granuloma or neoplasm. Non-contrast chest CT at 6-12 months is recommended. If the nodule is stable at time of repeat CT, then future CT at 18-24 months (from today's scan) is considered optional for low-risk patients, but is recommended for high-risk patients. This recommendation follows the consensus statement: Guidelines for Management of Incidental Pulmonary Nodules Detected on CT Images: From the Fleischner Society 2017; Radiology 2017; 284:228-243. There is small area of bronchiectasis in medial left upper lobe. Electronically Signed   By: Elmer Picker M.D.   On: 09/22/2022 13:07   DG Chest 2 View  Result Date: 09/22/2022 CLINICAL DATA:  56 year old female with history of chest  pain. EXAM: CHEST - 2 VIEW COMPARISON:  Chest x-ray 10/06/2021. FINDINGS: Lung volumes are normal. No consolidative airspace disease. No pleural effusions. No pneumothorax. No pulmonary nodule or mass noted. Pulmonary vasculature and the cardiomediastinal silhouette are within normal limits. IMPRESSION: No radiographic evidence of acute cardiopulmonary disease. Electronically Signed   By: Vinnie Langton M.D.   On: 09/22/2022 10:24    EKG: Normal sinus rhythm rate of 75 nonspecific ST-T changes  ASSESSMENT AND PLAN:  Non-STEMI Unstable angina Hypertension Smoking Obesity Alcohol abuse Generalized anxiety Elevated troponins. . Plan Agree with admit to telemetry rule out myocardial infarction follow-up EKGs and troponins Recommend echocardiogram for assessment of ventricular function wall motion Anticoagulation with heparin in the face of what appears to be a non-STEMI Because of non-STEMI and hypertension recommend beta-blockers ACE inhibitor or ARB aspirin statin Recommend statin therapy for hyperlipidemia Nitrate therapy initially to help with chest pain symptoms Recommend cardiac cath prior to discharge for evaluation of cardiac anatomy Advised patient refrain from tobacco abuse Recommend weight loss exercise portion control    Signed: Arvie Villarruel  Salome Arnt MD, 09/22/2022, 8:35 PM

## 2022-09-22 NOTE — ED Triage Notes (Signed)
Pt reports yesterday started with cp that was heavy in nature, constant and radiated into her left arm. Pt reports also feels nauseated at times.

## 2022-09-22 NOTE — Progress Notes (Signed)
ANTICOAGULATION CONSULT NOTE  Pharmacy Consult for heparin infusion initiation & monitoring Indication: chest pain/ACS  Allergies  Allergen Reactions   Bactrim [Sulfamethoxazole-Trimethoprim]    Penicillins Other (See Comments)    Hallucination and Skin falling off.  PT hasn't had Pen since baby and was hospitalized for it.    Sulfa Antibiotics     Patient Measurements: Height: 5\' 9"  (175.3 cm) Weight: 97 kg (213 lb 13.5 oz) IBW/kg (Calculated) : 66.2 Heparin Dosing Weight: 87 kg  Vital Signs: Temp: 98 F (36.7 C) (12/17 1008) Temp Source: Oral (12/17 1008) BP: 172/104 (12/17 1008) Pulse Rate: 83 (12/17 1008)  Labs: Recent Labs    09/22/22 1009  HGB 13.6  HCT 42.2  PLT 264  CREATININE 0.73  TROPONINIHS 2,338*    Estimated Creatinine Clearance: 99.6 mL/min (by C-G formula based on SCr of 0.73 mg/dL).   Medical History: No past medical history on file.  Assessment: 56 y.o. female w/ no significant PMH who presents with complaints of chest pain. HS troponin elevated. A review of medical records reveals no chronic anticoagulation PTA. Baseline labs pending  Goal of Therapy:  Heparin level 0.3-0.7 units/ml Monitor platelets by anticoagulation protocol: Yes   Plan:  Give 4000 units bolus x 1 Start heparin infusion at 1200 units/hr Check anti-Xa level in 6 hours and daily while on heparin Continue to monitor H&H and platelets  40 09/22/2022,11:09 AM

## 2022-09-22 NOTE — Assessment & Plan Note (Signed)
-   7 mm nodular density in the left lower lobe, discussed with the patient and family at bedside - Discussed recommended to discuss with outpatient PCP for repeat CT scan outpatient in 6 to 12 months - Patient and family endorses understanding and compliance

## 2022-09-22 NOTE — ED Provider Notes (Signed)
[facility] Provider Note   Event Date/Time   First MD Initiated Contact with Patient 09/22/22 1055     (approximate) History  Chest Pain, Nausea, and Shortness of Breath  HPI Jeanette Ball is a 56 y.o. female with a stated past medical history of hypertension, tobacco abuse, and nephrolithiasis who presents for chest pain over the last 3 days that has been intermittent and now is continuous, substernal, and radiating to the left arm and neck.  Patient describes this as a heaviness that is worse with exertion and with associated nausea.  Patient denies any pain similar to this in the past.  Patient states that she has taken 2 324 mg aspirin this morning secondary to pain with no improvement. ROS: Patient currently denies any vision changes, tinnitus, difficulty speaking, facial droop, sore throat, shortness of breath, abdominal pain, nausea/vomiting/diarrhea, dysuria, or weakness/numbness/paresthesias in any extremity   Physical Exam  Triage Vital Signs: ED Triage Vitals  Enc Vitals Group     BP 09/22/22 1008 (!) 172/104     Pulse Rate 09/22/22 1008 83     Resp 09/22/22 1008 20     Temp 09/22/22 1008 98 F (36.7 C)     Temp Source 09/22/22 1008 Oral     SpO2 09/22/22 1008 99 %     Weight 09/22/22 1005 213 lb 13.5 oz (97 kg)     Height 09/22/22 1005 5\' 9"  (1.753 m)     Head Circumference --      Peak Flow --      Pain Score 09/22/22 1005 10     Pain Loc --      Pain Edu? --      Excl. in GC? --    Most recent vital signs: Vitals:   09/22/22 1008  BP: (!) 172/104  Pulse: 83  Resp: 20  Temp: 98 F (36.7 C)  SpO2: 99%   General: Awake, oriented x4. CV:  Good peripheral perfusion.  Resp:  Normal effort.  Abd:  No distention.  Other:  Middle-aged overweight African-American female laying in bed in no acute distress ED Results / Procedures / Treatments  Labs (all labs ordered are listed, but only abnormal results are displayed) Labs Reviewed   BASIC METABOLIC PANEL - Abnormal; Notable for the following components:      Result Value   Glucose, Bld 108 (*)    All other components within normal limits  CBC - Abnormal; Notable for the following components:   WBC 13.6 (*)    All other components within normal limits  TROPONIN I (HIGH SENSITIVITY) - Abnormal; Notable for the following components:   Troponin I (High Sensitivity) 2,338 (*)    All other components within normal limits  HEPARIN LEVEL (UNFRACTIONATED)  PROTIME-INR  APTT  TROPONIN I (HIGH SENSITIVITY)   EKG ED ECG REPORT I, 04-23-2003, the attending physician, personally viewed and interpreted this ECG. Date: 09/22/2022 EKG Time: 1052 Rate: 81 Rhythm: normal sinus rhythm QRS Axis: normal Intervals: normal ST/T Wave abnormalities: normal Narrative Interpretation: no evidence of acute ischemia RADIOLOGY ED MD interpretation: 2 view chest x-ray interpreted by me shows no evidence of acute abnormalities including no pneumonia, pneumothorax, or widened mediastinum -Agree with radiology assessment Official radiology report(s): DG Chest 2 View  Result Date: 09/22/2022 CLINICAL DATA:  56 year old female with history of chest pain. EXAM: CHEST - 2 VIEW COMPARISON:  Chest x-ray 10/06/2021. FINDINGS: Lung volumes are normal. No consolidative airspace disease. No  pleural effusions. No pneumothorax. No pulmonary nodule or mass noted. Pulmonary vasculature and the cardiomediastinal silhouette are within normal limits. IMPRESSION: No radiographic evidence of acute cardiopulmonary disease. Electronically Signed   By: Vinnie Langton M.D.   On: 09/22/2022 10:24   PROCEDURES: Critical Care performed: Yes, see critical care procedure note(s) .1-3 Lead EKG Interpretation  Performed by: Naaman Plummer, MD Authorized by: Naaman Plummer, MD     Interpretation: normal     ECG rate:  84   ECG rate assessment: normal     Rhythm: sinus rhythm     Ectopy: none      Conduction: normal   CRITICAL CARE Performed by: Naaman Plummer  Total critical care time: 33 minutes  Critical care time was exclusive of separately billable procedures and treating other patients.  Critical care was necessary to treat or prevent imminent or life-threatening deterioration.  Critical care was time spent personally by me on the following activities: development of treatment plan with patient and/or surrogate as well as nursing, discussions with consultants, evaluation of patient's response to treatment, examination of patient, obtaining history from patient or surrogate, ordering and performing treatments and interventions, ordering and review of laboratory studies, ordering and review of radiographic studies, pulse oximetry and re-evaluation of patient's condition.  MEDICATIONS ORDERED IN ED: Medications  nitroGLYCERIN (NITROSTAT) SL tablet 0.4 mg (has no administration in time range)  heparin ADULT infusion 100 units/mL (25000 units/248mL) (1,200 Units/hr Intravenous New Bag/Given 09/22/22 1133)  heparin bolus via infusion 4,000 Units (4,000 Units Intravenous Bolus from Bag 09/22/22 1134)   IMPRESSION / MDM / ASSESSMENT AND PLAN / ED COURSE  I reviewed the triage vital signs and the nursing notes.                             The patient is on the cardiac monitor to evaluate for evidence of arrhythmia and/or significant heart rate changes. Patient's presentation is most consistent with acute presentation with potential threat to life or bodily function. Patient 56 year old female with the above-stated past medical history presents for 3 days of chest pain Workup: ECG, CXR, CBC, CMP, Troponin Findings: ECG: No overt evidence of STEMI. No evidence of Brugadas sign, delta wave, epsilon wave, significantly prolonged QTc, or malignant arrhythmia Troponin: 2338 Other Labs unremarkable for emergent problems. CXR: Without PTX, PNA, or widened mediastinum Last Stress Test:  Never Last Heart Catheterization: Never HEART Score: 5  Given History, Exam, and Workup concern for NSTEMI.  I have low suspicion for Pneumothorax, Pneumonia, Pulmonary Embolus, Tamponade, Aortic Dissection  Interventions: ASA 324or325mg  Heparin Bolus 60-70u/kg (max 5000) Heparin gtt about 12-15u/kg/hr (max 1000/hr) PRN analgesia with fentanyl, morphine PRN antiemetic therapy  Dispo: Admit   FINAL CLINICAL IMPRESSION(S) / ED DIAGNOSES   Final diagnoses:  NSTEMI (non-ST elevated myocardial infarction) ([place])  Chest pain due to myocardial ischemia, unspecified ischemic chest pain type   Rx / DC Orders   ED Discharge Orders     None      Note:  This document was prepared using Dragon voice recognition software and may include unintentional dictation errors.   Naaman Plummer, MD 09/22/22 [phone]

## 2022-09-22 NOTE — Hospital Course (Signed)
Jeanette Ball is a 56 year old female with history of tobacco use and otherwise healthy female who presents to the emergency department for chief concerns of chest pressure with radiation to her left arm. EKG did not show any ST elevation.  Troponin 4958.  Placed on heparin drip.  Seen by cardiology, cardiac cath today.

## 2022-09-22 NOTE — Progress Notes (Addendum)
ANTICOAGULATION CONSULT NOTE  Pharmacy Consult for heparin infusion initiation & monitoring Indication: chest pain/ACS  Allergies  Allergen Reactions   Bactrim [Sulfamethoxazole-Trimethoprim]    Penicillins Other (See Comments)    Hallucination and Skin falling off.  PT hasn't had Pen since baby and was hospitalized for it.    Sulfa Antibiotics     Patient Measurements: Height: 5\' 9"  (175.3 cm) Weight: 97 kg (213 lb 13.5 oz) IBW/kg (Calculated) : 66.2 Heparin Dosing Weight: 87 kg  Vital Signs: Temp: 97.7 F (36.5 C) (12/17 1800) Temp Source: Oral (12/17 1449) BP: 106/71 (12/17 2030) Pulse Rate: 90 (12/17 2030)  Labs: Recent Labs    09/22/22 1009 09/22/22 1203 09/22/22 1525 09/22/22 1730 09/22/22 1859  HGB 13.6  --   --   --   --   HCT 42.2  --   --   --   --   PLT 264  --   --   --   --   APTT  --  149*  --   --   --   LABPROT  --  14.1  --   --   --   INR  --  1.1  --   --   --   HEPARINUNFRC  --   --   --   --  0.19*  CREATININE 0.73  --   --   --   --   TROPONINIHS 2,338* 2,747* 3,489* 4,492* 4,577*     Estimated Creatinine Clearance: 99.6 mL/min (by C-G formula based on SCr of 0.73 mg/dL).   Medical History: Past Medical History:  Diagnosis Date   Alcohol use    Tobacco use     Assessment: 56 y.o. female w/ no significant PMH who presents with complaints of chest pain. HS troponin elevated. A review of medical records reveals no chronic anticoagulation PTA. Baseline labs pending  Goal of Therapy:  Heparin level 0.3-0.7 units/ml Monitor platelets by anticoagulation protocol: Yes   Plan: Heparin level subtherapeutic Give heparin 2600 units bolus x 1 Increase heparin infusion to 1450 units/hr Check anti-Xa level in 6 hours after rate change Continue to monitor H&H and platelets  40, PharmD 09/22/2022,10:11 PM

## 2022-09-22 NOTE — Assessment & Plan Note (Signed)
-   Ativan 0.5 mg IV every 6 hours as needed for anxiety, 3 doses ordered 

## 2022-09-22 NOTE — Assessment & Plan Note (Addendum)
-   Nitroglycerin ointment 1 inch, every 6 hours as needed for chest pain, 2 days ordered - Cardiology has been consulted by emergency medicine provider and by myself via secure chat - Cardiology inpatient consultation order placed via epic - Complete echo ordered - Continue heparin GTT - N.p.o. after midnight - Admit to telemetry cardiac, inpatient

## 2022-09-22 NOTE — Assessment & Plan Note (Addendum)
-   I suspect this is secondary to NSTEMI, treat her NSTEMI as above - Nitroglycerin ointment every 6 hours as needed for chest pain, 2 doses ordered - Morphine 2 mg IV every 4 hours for chest pain not relieved by nitroglycerin, 4 doses ordered

## 2022-09-22 NOTE — H&P (Addendum)
History and Physical   Jeanette Ball [id number] DOB: 1965/12/22 DOA: 09/22/2022  PCP: Elgie Collard, MD  Patient coming from: home   I have personally briefly reviewed patient's old medical records in [place].  Chief Concern: chest pain  HPI: Jeanette Ball is a 56 year old female with history of tobacco use and otherwise healthy female who presents to the emergency department for chief concerns of chest pressure with radiation to her left arm.  Initial vitals in the emergency department showed temperature of 98, respiration rate of 20, heart rate of 83, blood pressure 172/104, SpO2 of 99% on room air.  Serum sodium is 140, potassium 3.6, chloride 102, bicarb 27, BUN of 9, serum creatinine of 0.73, GFR greater than 60, nonfasting blood glucose 108, WBC 13.6, hemoglobin 13.6, platelets of 264.  High sensitive troponin was elevated at 2338, and on repeat was 2747.  EDP consulted cardiology, Dr. Clayborn Bigness who states he will see the patient and agrees with heparin on day of admission with plans for left heart cath.  ED treatment: Heparin bolus and gtt. aspirin 324 mg p.o. was ordered however was not administered as patient states that she has taken 2 doses of aspirin 325 at home. --------------------- At bedside patient was able to tell me her name, her age, the current location.  She does not appear to be in acute distress.  She reports that she developed chest pain/pressure on Thursday, 09/19/2022.  She reports she was sitting at the edge of her bed.  She thought it was reflux symptoms and she was walking her body back-and-forth until the pain went away.  She reports the pain lasted minutes on this day.  She went to work as usual on Friday.  The pain resumed on Friday evening and again she thought it was acid reflux and went away after she walked herself back and forth on her bed and then went to sleep.  On Friday the pain lasted hours.  The pain has  continued on Saturday and today, prompting her to present to the emergency department today.  She denies shortness of breath.  She endorses associated dull radiation to her shoulder blades in her shoulders and her arms, more on her left than her right arm.  She denies trauma to her person.  She endorses nausea and denies vomiting.  She denies pain to her neck and abnormal taste in her mouth.  She reports that she took 2 large doses of Bayer aspirin prior to presenting to the emergency department.  Social history: She lives at home with her daughter Minna Merritts.  She is a current tobacco user smoking about 1 pack/day.  She consumes EtOH daily, 3-4 drinks per day.  She reports that her last drink was on 09/21/2022, and she had 1/5 of a bottle of Baileys along with a couple of margaritas.  She denies history of withdrawals and/or DTs as she does not remember the last time she has been without alcohol for a day.  She currently works in a liquor store.  ROS: Constitutional: no weight change, no fever ENT/Mouth: no sore throat, no rhinorrhea Eyes: no eye pain, no vision changes Cardiovascular: + chest pain, no dyspnea,  no edema, no palpitations Respiratory: no cough, no sputum, no wheezing Gastrointestinal: + nausea, no vomiting, no diarrhea, no constipation Genitourinary: no urinary incontinence, no dysuria, no hematuria Musculoskeletal: no arthralgias, no myalgias Skin: no skin lesions, no pruritus, Neuro: + weakness, no loss of consciousness, no syncope Psych: no anxiety,  no depression, no decrease appetite Heme/Lymph: no bruising, no bleeding  ED Course: Discussed with emergency medicine provider, patient requiring hospitalization for chief concerns of NSTEMI.  Assessment/Plan  Principal Problem:   NSTEMI (non-ST elevated myocardial infarction) ([place]) Active Problems:   Chest pain   Tobacco use   Alcohol abuse   Leukocytosis   Anxiety disorder due to medical condition   Pulmonary nodule,  left   Assessment and Plan:  * NSTEMI (non-ST elevated myocardial infarction) ([facility]) - Nitroglycerin ointment 1 inch, every 6 hours as needed for chest pain, 2 days ordered - Cardiology has been consulted by emergency medicine provider and by myself via secure chat - Cardiology inpatient consultation order placed via epic - Complete echo ordered - Continue heparin GTT - N.p.o. after midnight - Admit to telemetry cardiac, inpatient  Chest pain - I suspect this is secondary to NSTEMI, treat her NSTEMI as above - Nitroglycerin ointment every 6 hours as needed for chest pain, 2 doses ordered - Morphine 2 mg IV every 4 hours for chest pain not relieved by nitroglycerin, 4 doses ordered  Alcohol abuse - Counseling given - CIWA protocol initiated  Tobacco use - As needed nicotine discussed - The risk tobacco use increases claudication of her vessels of her heart and throughout her body. - Extensive counseling given that the cessation of smoking, will decrease future damages to the vessel of her heart.  - Greater than 5 minutes spent counseling patient on tobacco cessation - Patient endorses understanding and compliance  Pulmonary nodule, left - 7 mm nodular density in the left lower lobe, discussed with the patient and family at bedside - Discussed recommended to discuss with outpatient PCP for repeat CT scan outpatient in 6 to 12 months - Patient and family endorses understanding and compliance  Anxiety disorder due to medical condition - Ativan 0.5 mg IV every 6 hours as needed for anxiety, 3 doses ordered  Leukocytosis - Suspect reactive in setting of NSTEMI - Low clinical suspicion for infectious etiology at this time - CBC in the a.m.  Chart reviewed.   DVT prophylaxis: Heparin GTT Code Status: Full code Diet: Heart healthy now; n.p.o. after midnight Family Communication: [place] and Dr. Leron Croak who has a pain clinic in [place] (sister) with patient's  permission Disposition Plan: Pending clinical course Consults called: Cardiology service Admission status: Telemetry cardiac, inpatient  Past Medical History:  Diagnosis Date   Alcohol use    Tobacco use    Past Surgical History:  Procedure Laterality Date   BREAST BIOPSY Left 2013   neg   BREAST BIOPSY Right 2009   neg   BREAST CYST ASPIRATION Bilateral    Social History:  reports that she has been smoking. She has a 7.00 pack-year smoking history. She has never used smokeless tobacco. She reports current alcohol use. She reports that she does not use drugs.  Allergies  Allergen Reactions   Bactrim [Sulfamethoxazole-Trimethoprim]    Penicillins Other (See Comments)    Hallucination and Skin falling off.  PT hasn't had Pen since baby and was hospitalized for it.    Sulfa Antibiotics    Family History  Problem Relation Age of Onset   Diabetes Mother    Family history: Family history reviewed and not pertinent  Prior to Admission medications   Medication Sig Start Date End Date Taking? Authorizing Provider  albuterol (VENTOLIN HFA) 108 (90 Base) MCG/ACT inhaler Inhale 2 puffs into the lungs every 6 (six) hours as needed for  wheezing or shortness of breath. 10/06/21   Jene Every, MD  methylPREDNISolone (MEDROL DOSEPAK) 4 MG TBPK tablet Take 6 pills on day one then decrease by 1 pill each day 06/18/18   Faythe Ghee, PA-C  predniSONE (DELTASONE) 50 MG tablet Take 1 tablet (50 mg total) by mouth daily with breakfast. 10/06/21   Jene Every, MD   Physical Exam: Vitals:   09/22/22 1005 09/22/22 1008  BP:  (!) 172/104  Pulse:  83  Resp:  20  Temp:  98 F (36.7 C)  TempSrc:  Oral  SpO2:  99%  Weight: 97 kg   Height: 5\' 9"  (1.753 m)    Constitutional: appears age-appropriate, NAD, calm, comfortable Eyes: PERRL, lids and conjunctivae normal ENMT: Mucous membranes are moist. Posterior pharynx clear of any exudate or lesions. Age-appropriate dentition. Hearing  appropriate Neck: normal, supple, no masses, no thyromegaly Respiratory: clear to auscultation bilaterally, no wheezing, no crackles. Normal respiratory effort. No accessory muscle use.  Cardiovascular: Regular rate and rhythm, no murmurs / rubs / gallops. No extremity edema. 2+ pedal pulses. No carotid bruits.  Abdomen: no tenderness, no masses palpated, no hepatosplenomegaly. Bowel sounds positive.  Musculoskeletal: no clubbing / cyanosis. No joint deformity upper and lower extremities. Good ROM, no contractures, no atrophy. Normal muscle tone.  Skin: no rashes, lesions, ulcers. No induration Neurologic: Sensation intact. Strength 5/5 in all 4.  Psychiatric: Normal judgment and insight. Alert and oriented x 3.  Mildly anxious mood.  EKG: independently reviewed, showing sinus rhythm with rate of 81, QTc 464  Chest x-ray on Admission: I personally reviewed and I agree with radiologist reading as below.  CT Angio Chest PE W/Cm &/Or Wo Cm  Result Date: 09/22/2022 CLINICAL DATA:  Chest pain EXAM: CT ANGIOGRAPHY CHEST WITH CONTRAST TECHNIQUE: Multidetector CT imaging of the chest was performed using the standard protocol during bolus administration of intravenous contrast. Multiplanar CT image reconstructions and MIPs were obtained to evaluate the vascular anatomy. RADIATION DOSE REDUCTION: This exam was performed according to the departmental dose-optimization program which includes automated exposure control, adjustment of the mA and/or kV according to patient size and/or use of iterative reconstruction technique. CONTRAST:  09/24/2022 OMNIPAQUE IOHEXOL 350 MG/ML SOLN COMPARISON:  Chest radiographs including the study done earlier today FINDINGS: Cardiovascular: There is homogeneous enhancement in thoracic aorta. There are no intraluminal filling defects in pulmonary artery branches. Mediastinum/Nodes: No significant lymphadenopathy seen. Lungs/Pleura: There is no focal pulmonary consolidation. Small  linear densities are seen in right middle lobe and left lower lobe. In image 83 of series 5, there is 7 mm nodular density in the anterior left lower lobe. There is paucity of lung markings in the anterior left lower lobe, possibly focal emphysema. In image 25, there is focal area of bronchial ectasia in medial left upper lobe. There is no pleural effusion or pneumothorax. Upper Abdomen: No acute findings are seen in upper abdomen. Musculoskeletal: No acute findings are seen. Review of the MIP images confirms the above findings. IMPRESSION: There is no evidence of pulmonary embolism. There is no evidence of thoracic aortic dissection. There is no focal pulmonary consolidation. There is 7 mm nodular density in left lower lobe which may be part of subsegmental atelectasis or scarring or lung nodule such as granuloma or neoplasm. Non-contrast chest CT at 6-12 months is recommended. If the nodule is stable at time of repeat CT, then future CT at 18-24 months (from today's scan) is considered optional for low-risk patients, but is  recommended for high-risk patients. This recommendation follows the consensus statement: Guidelines for Management of Incidental Pulmonary Nodules Detected on CT Images: From the Fleischner Society 2017; Radiology 2017; 284:228-243. There is small area of bronchiectasis in medial left upper lobe. Electronically Signed   By: Elmer Picker M.D.   On: 09/22/2022 13:07   DG Chest 2 View  Result Date: 09/22/2022 CLINICAL DATA:  56 year old female with history of chest pain. EXAM: CHEST - 2 VIEW COMPARISON:  Chest x-ray 10/06/2021. FINDINGS: Lung volumes are normal. No consolidative airspace disease. No pleural effusions. No pneumothorax. No pulmonary nodule or mass noted. Pulmonary vasculature and the cardiomediastinal silhouette are within normal limits. IMPRESSION: No radiographic evidence of acute cardiopulmonary disease. Electronically Signed   By: Vinnie Langton M.D.   On:  09/22/2022 10:24    Labs on Admission: I have personally reviewed following labs  CBC: Recent Labs  Lab 09/22/22 1009  WBC 13.6*  HGB 13.6  HCT 42.2  MCV 91.3  PLT [id number]   Basic Metabolic Panel: Recent Labs  Lab 09/22/22 1009  NA 140  K 3.6  CL 102  CO2 27  GLUCOSE 108*  BUN 9  CREATININE 0.73  CALCIUM 9.3  MG 2.2  PHOS 4.5   GFR: Estimated Creatinine Clearance: 99.6 mL/min (by C-G formula based on SCr of 0.73 mg/dL).  Coagulation Profile: Recent Labs  Lab 09/22/22 1203  INR 1.1   Urine analysis:    Component Value Date/Time   COLORURINE YELLOW (A) 11/30/2015 1608   APPEARANCEUR CLOUDY (A) 11/30/2015 1608   LABSPEC 1.006 11/30/2015 1608   PHURINE 7.0 11/30/2015 1608   GLUCOSEU NEGATIVE 11/30/2015 1608   HGBUR 3+ (A) 11/30/2015 1608   BILIRUBINUR NEGATIVE 11/30/2015 1608   KETONESUR NEGATIVE 11/30/2015 1608   PROTEINUR 100 (A) 11/30/2015 1608   NITRITE NEGATIVE 11/30/2015 1608   LEUKOCYTESUR 3+ (A) 11/30/2015 1608   This document was prepared using Dragon Voice Recognition software and may include unintentional dictation errors.  Dr. Tobie Poet [facility]  If 7PM-7AM, please contact overnight-coverage provider If 7AM-7PM, please contact day coverage provider [email or web address]  09/22/2022, 2:44 PM

## 2022-09-22 NOTE — Assessment & Plan Note (Signed)
-   Counseling given - CIWA protocol initiated

## 2022-09-22 NOTE — Assessment & Plan Note (Addendum)
-   As needed nicotine discussed - The risk tobacco use increases claudication of her vessels of her heart and throughout her body. - Extensive counseling given that the cessation of smoking, will decrease future damages to the vessel of her heart.  - Greater than 5 minutes spent counseling patient on tobacco cessation - Patient endorses understanding and compliance

## 2022-09-22 NOTE — Assessment & Plan Note (Signed)
-   Suspect reactive in setting of NSTEMI - Low clinical suspicion for infectious etiology at this time - CBC in the a.m.

## 2022-09-23 ENCOUNTER — Encounter
Admission: EM | Disposition: A | Discharge status: Home / Self Care | Payer: Self-pay | Source: Home / Self Care | Attending: Internal Medicine

## 2022-09-23 ENCOUNTER — Inpatient Hospital Stay
Admit: 2022-09-23 | Discharge: 2022-09-23 | Disposition: A | Discharge status: Home / Self Care | Payer: PRIVATE HEALTH INSURANCE | Attending: Internal Medicine | Admitting: Internal Medicine

## 2022-09-23 ENCOUNTER — Other Ambulatory Visit: Payer: Self-pay

## 2022-09-23 DIAGNOSIS — E876 Hypokalemia: Secondary | ICD-10-CM

## 2022-09-23 DIAGNOSIS — E669 Obesity, unspecified: Secondary | ICD-10-CM | POA: Insufficient documentation

## 2022-09-23 DIAGNOSIS — F1010 Alcohol abuse, uncomplicated: Secondary | ICD-10-CM

## 2022-09-23 HISTORY — PX: CORONARY STENT INTERVENTION: CATH118234

## 2022-09-23 HISTORY — PX: LEFT HEART CATH AND CORONARY ANGIOGRAPHY: CATH118249

## 2022-09-23 LAB — BASIC METABOLIC PANEL
Anion gap: 5 (ref 5–15)
BUN: 8 mg/dL (ref 6–20)
CO2: 27 mmol/L (ref 22–32)
Calcium: 8.8 mg/dL — ABNORMAL LOW (ref 8.9–10.3)
Chloride: 108 mmol/L (ref 98–111)
Creatinine, Ser: 0.7 mg/dL (ref 0.44–1.00)
GFR, Estimated: >60 mL/min (ref >60)
Glucose, Bld: 99 mg/dL (ref 70–99)
Potassium: 3.3 mmol/L — ABNORMAL LOW (ref 3.5–5.1)
Sodium: 140 mmol/L (ref 135–145)

## 2022-09-23 LAB — ECHOCARDIOGRAM COMPLETE
AR max vel: 2.41 cm2
AV Area VTI: 2.54 cm2
AV Area mean vel: 2.28 cm2
AV Mean grad: 3 mmHg
AV Peak grad: 6 mmHg
Ao pk vel: 1.22 m/s
Area-P 1/2: 4.26 cm2
Height: 69 in
S' Lateral: 3.5 cm
Weight: 3421.54 oz

## 2022-09-23 LAB — CBC
HCT: 36.2 % (ref 36.0–46.0)
Hemoglobin: 12 g/dL (ref 12.0–15.0)
MCH: 30.1 pg (ref 26.0–34.0)
MCHC: 33.1 g/dL (ref 30.0–36.0)
MCV: 90.7 fL (ref 80.0–100.0)
Platelets: 234 10*3/uL (ref 150–400)
RBC: 3.99 MIL/uL (ref 3.87–5.11)
RDW: 14.4 % (ref 11.5–15.5)
WBC: 10.1 10*3/uL (ref 4.0–10.5)
nRBC: 0 % (ref 0.0–0.2)

## 2022-09-23 LAB — TROPONIN I (HIGH SENSITIVITY): Troponin I (High Sensitivity): 4958 ng/L (ref <18)

## 2022-09-23 LAB — POCT ACTIVATED CLOTTING TIME: Activated Clotting Time: 558 seconds

## 2022-09-23 LAB — LIPID PANEL
Cholesterol: 220 mg/dL — ABNORMAL HIGH (ref 0–200)
HDL: 50 mg/dL (ref >40)
LDL Cholesterol: 146 mg/dL — ABNORMAL HIGH (ref 0–99)
Total CHOL/HDL Ratio: 4.4 RATIO
Triglycerides: 118 mg/dL (ref <150)
VLDL: 24 mg/dL (ref 0–40)

## 2022-09-23 LAB — HEPARIN LEVEL (UNFRACTIONATED): Heparin Unfractionated: 0.54 IU/mL (ref 0.30–0.70)

## 2022-09-23 SURGERY — LEFT HEART CATH AND CORONARY ANGIOGRAPHY
Anesthesia: Moderate Sedation

## 2022-09-23 MED ORDER — FENTANYL CITRATE (PF) 100 MCG/2ML IJ SOLN
INTRAMUSCULAR | Status: AC
  Filled 2022-09-23: qty 2

## 2022-09-23 MED ORDER — VERAPAMIL HCL 2.5 MG/ML IV SOLN
INTRAVENOUS | Status: AC
  Filled 2022-09-23: qty 2

## 2022-09-23 MED ORDER — NITROGLYCERIN 1 MG/10 ML FOR IR/CATH LAB
INTRA_ARTERIAL | Status: AC
  Filled 2022-09-23: qty 10

## 2022-09-23 MED ORDER — SODIUM CHLORIDE 0.9 % IV SOLN
INTRAVENOUS | Status: DC | PRN
  Administered 2022-09-23: 1.75 mg/kg/h via INTRAVENOUS

## 2022-09-23 MED ORDER — TICAGRELOR 90 MG PO TABS
ORAL_TABLET | ORAL | Status: DC | PRN
  Administered 2022-09-23: 180 mg via ORAL

## 2022-09-23 MED ORDER — ACETAMINOPHEN 325 MG PO TABS: 650.0000 mg | ORAL_TABLET | ORAL | Status: DC | PRN

## 2022-09-23 MED ORDER — MIDAZOLAM HCL 2 MG/2ML IJ SOLN
INTRAMUSCULAR | Status: AC
  Filled 2022-09-23: qty 2

## 2022-09-23 MED ORDER — SODIUM CHLORIDE 0.9 % WEIGHT BASED INFUSION
3.0000 mL/kg/h | INTRAVENOUS | Status: DC
  Administered 2022-09-23: 3 mL/kg/h via INTRAVENOUS

## 2022-09-23 MED ORDER — SODIUM CHLORIDE 0.9 % IV SOLN: 250.0000 mL | INTRAVENOUS | Status: DC | PRN

## 2022-09-23 MED ORDER — LIDOCAINE HCL 1 % IJ SOLN
INTRAMUSCULAR | Status: AC
  Filled 2022-09-23: qty 20

## 2022-09-23 MED ORDER — ASPIRIN 81 MG PO CHEW
CHEWABLE_TABLET | ORAL | Status: AC
  Filled 2022-09-23: qty 3

## 2022-09-23 MED ORDER — NITROGLYCERIN 1 MG/10 ML FOR IR/CATH LAB
INTRA_ARTERIAL | Status: DC | PRN
  Administered 2022-09-23 (×2): 200 ug via INTRACORONARY

## 2022-09-23 MED ORDER — POTASSIUM CHLORIDE CRYS ER 20 MEQ PO TBCR
40.0000 meq | EXTENDED_RELEASE_TABLET | ORAL | Status: AC
  Administered 2022-09-23 (×2): 40 meq via ORAL
  Filled 2022-09-23 (×2): qty 2

## 2022-09-23 MED ORDER — BIVALIRUDIN BOLUS VIA INFUSION - CUPID
INTRAVENOUS | Status: DC | PRN
  Administered 2022-09-23: 72.75 mg via INTRAVENOUS

## 2022-09-23 MED ORDER — BIVALIRUDIN TRIFLUOROACETATE 250 MG IV SOLR
INTRAVENOUS | Status: AC
  Filled 2022-09-23: qty 250

## 2022-09-23 MED ORDER — SODIUM CHLORIDE 0.9% FLUSH: 3.0000 mL | INTRAVENOUS | Status: DC | PRN

## 2022-09-23 MED ORDER — HEPARIN SODIUM (PORCINE) 1000 UNIT/ML IJ SOLN
INTRAMUSCULAR | Status: AC
  Filled 2022-09-23: qty 10

## 2022-09-23 MED ORDER — HYDRALAZINE HCL 20 MG/ML IJ SOLN: 10.0000 mg | INTRAMUSCULAR | Status: AC | PRN

## 2022-09-23 MED ORDER — VERAPAMIL HCL 2.5 MG/ML IV SOLN
INTRAVENOUS | Status: DC | PRN
  Administered 2022-09-23 (×2): 2.5 mg via INTRA_ARTERIAL

## 2022-09-23 MED ORDER — ASPIRIN 81 MG PO CHEW
81.0000 mg | CHEWABLE_TABLET | Freq: Every day | ORAL | Status: DC
  Administered 2022-09-24: 81 mg via ORAL
  Filled 2022-09-23: qty 1

## 2022-09-23 MED ORDER — SODIUM CHLORIDE 0.9 % WEIGHT BASED INFUSION: 1.0000 mL/kg/h | INTRAVENOUS | Status: DC

## 2022-09-23 MED ORDER — TICAGRELOR 90 MG PO TABS
90.0000 mg | ORAL_TABLET | Freq: Two times a day (BID) | ORAL | Status: DC
  Administered 2022-09-23 – 2022-09-24 (×2): 90 mg via ORAL
  Filled 2022-09-23 (×2): qty 1

## 2022-09-23 MED ORDER — ONDANSETRON HCL 4 MG/2ML IJ SOLN: 4.0000 mg | Freq: Four times a day (QID) | INTRAMUSCULAR | Status: DC | PRN

## 2022-09-23 MED ORDER — TICAGRELOR 90 MG PO TABS
ORAL_TABLET | ORAL | Status: AC
  Filled 2022-09-23: qty 2

## 2022-09-23 MED ORDER — MIDAZOLAM HCL 2 MG/2ML IJ SOLN
INTRAMUSCULAR | Status: DC | PRN
  Administered 2022-09-23: 1 mg via INTRAVENOUS

## 2022-09-23 MED ORDER — LIDOCAINE HCL (PF) 1 % IJ SOLN
INTRAMUSCULAR | Status: DC | PRN
  Administered 2022-09-23: 2 mL

## 2022-09-23 MED ORDER — HEPARIN SODIUM (PORCINE) 1000 UNIT/ML IJ SOLN
INTRAMUSCULAR | Status: DC | PRN
  Administered 2022-09-23: 5000 [IU] via INTRAVENOUS

## 2022-09-23 MED ORDER — HEPARIN (PORCINE) IN NACL 1000-0.9 UT/500ML-% IV SOLN
INTRAVENOUS | Status: DC | PRN
  Administered 2022-09-23 (×2): 500 mL

## 2022-09-23 MED ORDER — SODIUM CHLORIDE 0.9 % WEIGHT BASED INFUSION
1.0000 mL/kg/h | INTRAVENOUS | Status: DC
  Administered 2022-09-23: 1 mL/kg/h via INTRAVENOUS

## 2022-09-23 MED ORDER — ASPIRIN 81 MG PO CHEW
CHEWABLE_TABLET | ORAL | Status: DC | PRN
  Administered 2022-09-23: 243 mg via ORAL

## 2022-09-23 MED ORDER — IOHEXOL 300 MG/ML  SOLN
INTRAMUSCULAR | Status: DC | PRN
  Administered 2022-09-23: 230 mL

## 2022-09-23 MED ORDER — FENTANYL CITRATE (PF) 100 MCG/2ML IJ SOLN
INTRAMUSCULAR | Status: DC | PRN
  Administered 2022-09-23: 25 ug via INTRAVENOUS

## 2022-09-23 MED ORDER — LABETALOL HCL 5 MG/ML IV SOLN: 10.0000 mg | INTRAVENOUS | Status: AC | PRN

## 2022-09-23 MED ORDER — HEPARIN (PORCINE) IN NACL 1000-0.9 UT/500ML-% IV SOLN
INTRAVENOUS | Status: AC
  Filled 2022-09-23: qty 1000

## 2022-09-23 MED ORDER — ASPIRIN 81 MG PO CHEW: 81.0000 mg | CHEWABLE_TABLET | ORAL | Status: DC

## 2022-09-23 MED ORDER — SODIUM CHLORIDE 0.9% FLUSH
3.0000 mL | Freq: Two times a day (BID) | INTRAVENOUS | Status: DC
  Administered 2022-09-24 (×2): 3 mL via INTRAVENOUS

## 2022-09-23 SURGICAL SUPPLY — 16 items
BALLN TREK RX 3.0X15 (BALLOONS) ×1
BALLOON TREK RX 3.0X15 (BALLOONS) IMPLANT
CATH 5FR JL3.5 JR4 ANG PIG MP (CATHETERS) IMPLANT
CATH VISTA GUIDE 6FR XB3.5 SH (CATHETERS) IMPLANT
DEVICE RAD TR BAND REGULAR (VASCULAR PRODUCTS) IMPLANT
DRAPE BRACHIAL (DRAPES) IMPLANT
GLIDESHEATH SLEND SS 6F .021 (SHEATH) IMPLANT
GUIDEWIRE INQWIRE 1.5J.035X260 (WIRE) IMPLANT
INQWIRE 1.5J .035X260CM (WIRE) ×1
PACK CARDIAC CATH (CUSTOM PROCEDURE TRAY) ×1 IMPLANT
PROTECTION STATION PRESSURIZED (MISCELLANEOUS) ×1
SET ATX SIMPLICITY (MISCELLANEOUS) IMPLANT
STATION PROTECTION PRESSURIZED (MISCELLANEOUS) IMPLANT
STENT ONYX FRONTIER 3.5X18 (Permanent Stent) IMPLANT
TUBING CIL FLEX 10 FLL-RA (TUBING) IMPLANT
WIRE G HI TQ BMW 190 (WIRE) IMPLANT

## 2022-09-23 NOTE — Progress Notes (Signed)
*  PRELIMINARY RESULTS* Echocardiogram 2D Echocardiogram has been performed.  Jeanette Ball 09/23/2022, 10:32 AM

## 2022-09-23 NOTE — CV Procedure (Signed)
Brief cath note  Patient cardiac cath because of non-STEMI  Right radial approach Left ventriculogram showed preserved left ventricular function of at least 55%  Coronaries Left main large no significant disease LAD large no significant disease Circumflex very large with a 95% mid lesion TIMI-3 flow RCA large no significant disease  Intervention Successful PCI and stent DES to mid circumflex 3.5 x 18 mm Frontier Onyx  Lesion reduced from 95 down to 0% TIMI-3 flow was maintained throughout the case  Patient placed on aspirin Brilinta  Hopefully patient will be stable for discharge home tomorrow Full Note to follow

## 2022-09-23 NOTE — Progress Notes (Signed)
ANTICOAGULATION CONSULT NOTE  Pharmacy Consult for heparin infusion initiation & monitoring Indication: chest pain/ACS  Allergies  Allergen Reactions   Bactrim [Sulfamethoxazole-Trimethoprim]    Penicillins Other (See Comments)    Hallucination and Skin falling off.  PT hasn't had Pen since baby and was hospitalized for it.    Sulfa Antibiotics     Patient Measurements: Height: 5\' 9"  (175.3 cm) Weight: 97 kg (213 lb 13.5 oz) IBW/kg (Calculated) : 66.2 Heparin Dosing Weight: 87 kg  Vital Signs: Temp: 98 F (36.7 C) (12/18 0206) Temp Source: Oral (12/18 0206) BP: 104/75 (12/18 0206) Pulse Rate: 73 (12/18 0206)  Labs: Recent Labs    09/22/22 1009 09/22/22 1203 09/22/22 1525 09/22/22 1730 09/22/22 1859 09/23/22 0451  HGB 13.6  --   --   --   --  12.0  HCT 42.2  --   --   --   --  36.2  PLT 264  --   --   --   --  234  APTT  --  149*  --   --   --   --   LABPROT  --  14.1  --   --   --   --   INR  --  1.1  --   --   --   --   HEPARINUNFRC  --   --   --   --  0.19* 0.54  CREATININE 0.73  --   --   --   --  0.70  TROPONINIHS 2,338* 2,747*   < > 4,492* 4,577* 4,958*   < > = values in this interval not displayed.     Estimated Creatinine Clearance: 99.6 mL/min (by C-G formula based on SCr of 0.7 mg/dL).   Medical History: Past Medical History:  Diagnosis Date   Alcohol use    Tobacco use     Assessment: 56 y.o. female w/ no significant PMH who presents with complaints of chest pain. HS troponin elevated. A review of medical records reveals no chronic anticoagulation PTA. Baseline labs pending  Goal of Therapy:  Heparin level 0.3-0.7 units/ml Monitor platelets by anticoagulation protocol: Yes   12/18 0451 HL 0.54, therapeutic x 1  Plan:  Continue heparin infusion at 1450 units/hr Will recheck HL at 1200 to confirm Continue to monitor H&H and platelets  1/19, PharmD, [facility] 09/23/2022 5:44 AM

## 2022-09-23 NOTE — Progress Notes (Signed)
[facility] Cardiology    SUBJECTIVE: Patient is pain-free no shortness of breath feels reasonably well prepared for cardiac cath risk and benefits have been discussed   Vitals:   09/22/22 2239 09/23/22 0206 09/23/22 0558 09/23/22 0751  BP: (!) 123/91 104/75 105/77 120/79  Pulse: 86 73 77 72  Resp: 18 (!) 21 (!) 21 (!) 21  Temp:  98 F (36.7 C) 98.1 F (36.7 C) 98.6 F (37 C)  TempSrc:  Oral Oral Oral  SpO2: 100% 96% 98% 99%  Weight:      Height:         Intake/Output Summary (Last 24 hours) at 09/23/2022 0830 Last data filed at 09/22/2022 1520 Gross per 24 hour  Intake 1000 ml  Output --  Net 1000 ml      PHYSICAL EXAM  General: Well developed, well nourished, in no acute distress HEENT:  Normocephalic and atramatic Neck:  No JVD.  Lungs: Clear bilaterally to auscultation and percussion. Heart: HRRR . Normal S1 and S2 without gallops or murmurs.  Abdomen: Bowel sounds are positive, abdomen soft and non-tender  Msk:  Back normal, normal gait. Normal strength and tone for age. Extremities: No clubbing, cyanosis or edema.   Neuro: Alert and oriented X 3. Psych:  Good affect, responds appropriately   LABS: Basic Metabolic Panel: Recent Labs    09/22/22 1009 09/23/22 0451  NA 140 140  K 3.6 3.3*  CL 102 108  CO2 27 27  GLUCOSE 108* 99  BUN 9 8  CREATININE 0.73 0.70  CALCIUM 9.3 8.8*  MG 2.2  --   PHOS 4.5  --    Liver Function Tests: No results for input(s): "AST", "ALT", "ALKPHOS", "BILITOT", "PROT", "ALBUMIN" in the last 72 hours. No results for input(s): "LIPASE", "AMYLASE" in the last 72 hours. CBC: Recent Labs    09/22/22 1009 09/23/22 0451  WBC 13.6* 10.1  HGB 13.6 12.0  HCT 42.2 36.2  MCV 91.3 90.7  PLT 264 234   Cardiac Enzymes: No results for input(s): "CKTOTAL", "CKMB", "CKMBINDEX", "TROPONINI" in the last 72 hours. BNP: Invalid input(s): "POCBNP" D-Dimer: No results for input(s): "DDIMER" in the last 72 hours. Hemoglobin A1C: No  results for input(s): "HGBA1C" in the last 72 hours. Fasting Lipid Panel: Recent Labs    09/23/22 0451  CHOL 220*  HDL 50  LDLCALC 146*  TRIG 118  CHOLHDL 4.4   Thyroid Function Tests: No results for input(s): "TSH", "T4TOTAL", "T3FREE", "THYROIDAB" in the last 72 hours.  Invalid input(s): "FREET3" Anemia Panel: No results for input(s): "VITAMINB12", "FOLATE", "FERRITIN", "TIBC", "IRON", "RETICCTPCT" in the last 72 hours.  CT Angio Chest PE W/Cm &/Or Wo Cm  Result Date: 09/22/2022 CLINICAL DATA:  Chest pain EXAM: CT ANGIOGRAPHY CHEST WITH CONTRAST TECHNIQUE: Multidetector CT imaging of the chest was performed using the standard protocol during bolus administration of intravenous contrast. Multiplanar CT image reconstructions and MIPs were obtained to evaluate the vascular anatomy. RADIATION DOSE REDUCTION: This exam was performed according to the departmental dose-optimization program which includes automated exposure control, adjustment of the mA and/or kV according to patient size and/or use of iterative reconstruction technique. CONTRAST:  156mL OMNIPAQUE IOHEXOL 350 MG/ML SOLN COMPARISON:  Chest radiographs including the study done earlier today FINDINGS: Cardiovascular: There is homogeneous enhancement in thoracic aorta. There are no intraluminal filling defects in pulmonary artery branches. Mediastinum/Nodes: No significant lymphadenopathy seen. Lungs/Pleura: There is no focal pulmonary consolidation. Small linear densities are seen in right middle lobe and left  lower lobe. In image 83 of series 5, there is 7 mm nodular density in the anterior left lower lobe. There is paucity of lung markings in the anterior left lower lobe, possibly focal emphysema. In image 25, there is focal area of bronchial ectasia in medial left upper lobe. There is no pleural effusion or pneumothorax. Upper Abdomen: No acute findings are seen in upper abdomen. Musculoskeletal: No acute findings are seen. Review of  the MIP images confirms the above findings. IMPRESSION: There is no evidence of pulmonary embolism. There is no evidence of thoracic aortic dissection. There is no focal pulmonary consolidation. There is 7 mm nodular density in left lower lobe which may be part of subsegmental atelectasis or scarring or lung nodule such as granuloma or neoplasm. Non-contrast chest CT at 6-12 months is recommended. If the nodule is stable at time of repeat CT, then future CT at 18-24 months (from today's scan) is considered optional for low-risk patients, but is recommended for high-risk patients. This recommendation follows the consensus statement: Guidelines for Management of Incidental Pulmonary Nodules Detected on CT Images: From the Fleischner Society 2017; Radiology 2017; 284:228-243. There is small area of bronchiectasis in medial left upper lobe. Electronically Signed   By: Ernie Avena M.D.   On: 09/22/2022 13:07   DG Chest 2 View  Result Date: 09/22/2022 CLINICAL DATA:  56 year old female with history of chest pain. EXAM: CHEST - 2 VIEW COMPARISON:  Chest x-ray 10/06/2021. FINDINGS: Lung volumes are normal. No consolidative airspace disease. No pleural effusions. No pneumothorax. No pulmonary nodule or mass noted. Pulmonary vasculature and the cardiomediastinal silhouette are within normal limits. IMPRESSION: No radiographic evidence of acute cardiopulmonary disease. Electronically Signed   By: Trudie Reed M.D.   On: 09/22/2022 10:24     Echo pending  TELEMETRY: Normal sinus rhythm rate of 75 nonspecific T wave changes:  ASSESSMENT AND PLAN:  Principal Problem:   NSTEMI (non-ST elevated myocardial infarction) (HCC) Active Problems:   Tobacco use   Chest pain   Leukocytosis   Alcohol abuse   Anxiety disorder due to medical condition   Pulmonary nodule, left   Hypokalemia    Plan Non-STEMI.  Troponins were 5000 pain-free recommend cardiac cath Smoking advised cessation from tobacco  abuse History of significant (advised patient refrain from alcohol abuse Hypertension-compliant recommend management with antihypertensive beta-blocker and ACE nitrates Obesity mild recommend modest weight loss exercise portion control Hyperlipidemia recommend high-dose statin therapy with Crestor 40 mg a day Proceed with cardiac catheter teaching and treatment possible PCI and stent    Alwyn Pea, MD 09/23/2022 8:30 AM

## 2022-09-23 NOTE — Progress Notes (Signed)
  Progress Note   Patient: Jeanette Ball AST:[id number] DOB: 12-23-1965 DOA: 09/22/2022     1 DOS: the patient was seen and examined on 09/23/2022   Brief hospital course: Ms. Mikinzie Maciejewski is a 56 year old female with history of tobacco use and otherwise healthy female who presents to the emergency department for chief concerns of chest pressure with radiation to her left arm. EKG did not show any ST elevation.  Troponin 4958.  Placed on heparin drip.  Seen by cardiology, cardiac cath today.   Assessment and Plan: * NSTEMI (non-ST elevated myocardial infarction) (HCC) Significant elevation troponin without ST elevation EKG.  Continue heparin drip.  Patient currently going to look heart cath per cardiology.  Hypokalemia. Alcohol abuse Watch for withdrawal, continue CIWA protocol.  Replete potassium.  Tobacco use Advised to quit.  Pulmonary nodule, left - 7 mm nodular density in the left lower lobe, outpatient follow-up with PCP.  Anxiety disorder due to medical condition As needed Ativan.  Leukocytosis Due to myocardial infarction, no infection.       Subjective:  Patient doing better today, no chest pain.  No shortness of breath.  Physical Exam: Vitals:   09/23/22 0751 09/23/22 1100 09/23/22 1203 09/23/22 1308  BP: 120/79 125/84 (!) 130/93   Pulse: 72 94 99   Resp: (!) 21 (!) 26 20   Temp: 98.6 F (37 C)  98.3 F (36.8 C)   TempSrc: Oral  Oral   SpO2: 99% 100% 100% 98%  Weight:      Height:       General exam: Appears calm and comfortable  Respiratory system: Clear to auscultation. Respiratory effort normal. Cardiovascular system: S1 & S2 heard, RRR. No JVD, murmurs, rubs, gallops or clicks. No pedal edema. Gastrointestinal system: Abdomen is nondistended, soft and nontender. No organomegaly or masses felt. Normal bowel sounds heard. Central nervous system: Alert and oriented. No focal neurological deficits. Extremities: Symmetric 5 x 5 power. Skin: No  rashes, lesions or ulcers Psychiatry: Judgement and insight appear normal. Mood & affect appropriate.   Data Reviewed:  Review lab results.  Family Communication: Daughter updated at bedside.  Disposition: Status is: Inpatient Remains inpatient appropriate because: Severity of disease, IV treatment.  Planned Discharge Destination: Home    Time spent: 35 minutes  Author: Marrion Coy, MD 09/23/2022 1:35 PM  For on call review [email or web address].

## 2022-09-24 ENCOUNTER — Other Ambulatory Visit (HOSPITAL_COMMUNITY): Payer: Self-pay

## 2022-09-24 DIAGNOSIS — R911 Solitary pulmonary nodule: Secondary | ICD-10-CM

## 2022-09-24 DIAGNOSIS — Z720 Tobacco use: Secondary | ICD-10-CM

## 2022-09-24 LAB — BASIC METABOLIC PANEL
Anion gap: 6 (ref 5–15)
BUN: 11 mg/dL (ref 6–20)
CO2: 22 mmol/L (ref 22–32)
Calcium: 8.7 mg/dL — ABNORMAL LOW (ref 8.9–10.3)
Chloride: 113 mmol/L — ABNORMAL HIGH (ref 98–111)
Creatinine, Ser: 0.72 mg/dL (ref 0.44–1.00)
GFR, Estimated: >60 mL/min (ref >60)
Glucose, Bld: 92 mg/dL (ref 70–99)
Potassium: 3.7 mmol/L (ref 3.5–5.1)
Sodium: 141 mmol/L (ref 135–145)

## 2022-09-24 LAB — CBC
HCT: 38.1 % (ref 36.0–46.0)
Hemoglobin: 12.6 g/dL (ref 12.0–15.0)
MCH: 29.8 pg (ref 26.0–34.0)
MCHC: 33.1 g/dL (ref 30.0–36.0)
MCV: 90.1 fL (ref 80.0–100.0)
Platelets: 248 10*3/uL (ref 150–400)
RBC: 4.23 MIL/uL (ref 3.87–5.11)
RDW: 14.2 % (ref 11.5–15.5)
WBC: 9.7 10*3/uL (ref 4.0–10.5)
nRBC: 0 % (ref 0.0–0.2)

## 2022-09-24 LAB — PHOSPHORUS: Phosphorus: 4.8 mg/dL — ABNORMAL HIGH (ref 2.5–4.6)

## 2022-09-24 LAB — MAGNESIUM: Magnesium: 2.4 mg/dL (ref 1.7–2.4)

## 2022-09-24 MED ORDER — LOSARTAN POTASSIUM 50 MG PO TABS: 50.0000 mg | ORAL_TABLET | Freq: Every day | ORAL | 0 refills | Status: AC

## 2022-09-24 MED ORDER — ASPIRIN 81 MG PO CHEW: 81.0000 mg | CHEWABLE_TABLET | Freq: Every day | ORAL | 0 refills | Status: AC

## 2022-09-24 MED ORDER — CLOPIDOGREL BISULFATE 75 MG PO TABS: 75.0000 mg | ORAL_TABLET | Freq: Every day | ORAL | Status: DC

## 2022-09-24 MED ORDER — CLOPIDOGREL BISULFATE 75 MG PO TABS: 75.0000 mg | ORAL_TABLET | Freq: Every day | ORAL | 0 refills | Status: AC

## 2022-09-24 MED ORDER — TICAGRELOR 90 MG PO TABS: 90.0000 mg | ORAL_TABLET | Freq: Two times a day (BID) | ORAL | 0 refills | Status: DC

## 2022-09-24 MED ORDER — NICOTINE 21 MG/24HR TD PT24: 21.0000 mg | MEDICATED_PATCH | Freq: Every day | TRANSDERMAL | 0 refills | Status: AC | PRN

## 2022-09-24 MED ORDER — CLOPIDOGREL BISULFATE 75 MG PO TABS
300.0000 mg | ORAL_TABLET | Freq: Once | ORAL | Status: AC
  Administered 2022-09-24: 300 mg via ORAL
  Filled 2022-09-24: qty 4

## 2022-09-24 MED ORDER — ROSUVASTATIN CALCIUM 40 MG PO TABS: 40.0000 mg | ORAL_TABLET | Freq: Every day | ORAL | 0 refills | Status: AC

## 2022-09-24 MED ORDER — METOPROLOL TARTRATE 25 MG PO TABS: 25.0000 mg | ORAL_TABLET | Freq: Two times a day (BID) | ORAL | 0 refills | Status: AC

## 2022-09-24 NOTE — Consult Note (Signed)
Co-pay for ticagrelor is $35. I gave the pharmacy a free 30 day coupon and it went through.   Paschal Dopp, PharmD, BCPS

## 2022-09-24 NOTE — Progress Notes (Signed)
Consult for SA resources, patient declined. Reports her daughter will pick up today. NO dc needs.   TOC signing off.   Darolyn Rua, [place], MSW, [place] [phone]

## 2022-09-24 NOTE — Discharge Summary (Addendum)
Physician Discharge Summary   Patient: Jeanette Ball MRN: [id number] DOB: [birth year]  Admit date:     09/22/2022  Discharge date: 09/24/22  Discharge Physician: Sharen Hones   PCP: Elgie Collard, MD   Recommendations at discharge:   Follow-up with PCP in 1 week. Quit smoking. Follow-up lung nodule with PCP. Follow-up with Dr. Clayborn Bigness in 1 week.  Discharge Diagnoses: Principal Problem:   NSTEMI (non-ST elevated myocardial infarction) ([place]) Active Problems:   Chest pain   Tobacco use   Alcohol abuse   Leukocytosis   Anxiety disorder due to medical condition   Pulmonary nodule, left   Hypokalemia   Obesity (BMI 30-39.9)  Resolved Problems:   * No resolved hospital problems. [facility] Course: Jeanette Ball is a 56 year old female with history of tobacco use and otherwise healthy female who presents to the emergency department for chief concerns of chest pressure with radiation to her left arm. EKG did not show any ST elevation.  Troponin 4958.  Placed on heparin drip.  Seen by cardiology, cardiac cath 95% stenosis in circumflex, drug eluting stent placed.    Assessment and Plan:  * NSTEMI (non-ST elevated myocardial infarction) ([place]) Status post drug-eluting stent in circumflex.  Continue dual antiplatelet treatment, statin as well as Toprol-XL and losartan.  Follow-up with cardiology as outpatient.   Hypokalemia. Alcohol abuse No withdrawal.  Electrolytes normalized.   Tobacco use Advised to quit.   Pulmonary nodule, left - 7 mm nodular density in the left lower lobe, outpatient follow-up with PCP.   Anxiety disorder due to medical condition Improved.   Leukocytosis Due to myocardial infarction, no infection. Resolved.    Addendum: Patient could not tolerate Brilinta, cardiology has added Plavix.  Consultants: Cardiology Procedures performed: Heart cath  Disposition: Home Diet recommendation:  Discharge Diet Orders (From admission,  onward)     Start     Ordered   09/24/22 0000  Diet - low sodium heart healthy        09/24/22 1015           Cardiac diet DISCHARGE MEDICATION: Allergies as of 09/24/2022       Reactions   Bactrim [sulfamethoxazole-trimethoprim]    Penicillins Other (See Comments)   Hallucination and Skin falling off.  PT hasn't had Pen since baby and was hospitalized for it.    Sulfa Antibiotics         Medication List     STOP taking these medications    albuterol 108 (90 Base) MCG/ACT inhaler Commonly known as: VENTOLIN HFA   methylPREDNISolone 4 MG Tbpk tablet Commonly known as: MEDROL DOSEPAK   predniSONE 50 MG tablet Commonly known as: DELTASONE       TAKE these medications    aspirin 81 MG chewable tablet Chew 1 tablet (81 mg total) by mouth daily.   losartan 50 MG tablet Commonly known as: COZAAR Take 1 tablet (50 mg total) by mouth daily.   metoprolol tartrate 25 MG tablet Commonly known as: LOPRESSOR Take 1 tablet (25 mg total) by mouth 2 (two) times daily.   nicotine 21 mg/24hr patch Commonly known as: NICODERM CQ - dosed in mg/24 hours Place 1 patch (21 mg total) onto the skin daily as needed (nicotine craving).   rosuvastatin 40 MG tablet Commonly known as: CRESTOR Take 1 tablet (40 mg total) by mouth daily.   ticagrelor 90 MG Tabs tablet Commonly known as: BRILINTA Take 1 tablet (90 mg total) by mouth 2 (two) times  daily.        Follow-up Information     Elgie Collard, MD Follow up in 1 week(s).   Specialty: Obstetrics and Gynecology Contact information: [place] [address] [phone]         Yolonda Kida, MD Follow up in 1 week(s).   Specialties: Cardiology, Internal Medicine Contact information: [place] [address] [phone]                Discharge Exam: Danley Danker Weights   09/22/22 1005  Weight: 97 kg   General exam: Appears calm and comfortable   Respiratory system: Clear to auscultation. Respiratory effort normal. Cardiovascular system: S1 & S2 heard, RRR. No JVD, murmurs, rubs, gallops or clicks. No pedal edema. Gastrointestinal system: Abdomen is nondistended, soft and nontender. No organomegaly or masses felt. Normal bowel sounds heard. Central nervous system: Alert and oriented. No focal neurological deficits. Extremities: Symmetric 5 x 5 power. Skin: No rashes, lesions or ulcers Psychiatry: Judgement and insight appear normal. Mood & affect appropriate.    Condition at discharge: good  The results of significant diagnostics from this hospitalization (including imaging, microbiology, ancillary and laboratory) are listed below for reference.   Imaging Studies: ECHOCARDIOGRAM COMPLETE  Result Date: 09/23/2022    ECHOCARDIOGRAM REPORT   Patient Name:   Jeanette Ball Date of Exam: 09/23/2022 Medical Rec #:  [id number]          Height:       69.0 in Accession #:    [id number]         Weight:       213.8 lb Date of Birth:  10-18-1965          BSA:          2.126 m Patient Age:    38 years           BP:           120/79 mmHg Patient Gender: F                  HR:           88 bpm. Exam Location:  [facility] Procedure: 2D Echo, Color Doppler and Cardiac Doppler Indications:     R07.9 Chest Pain; Elevated Troponin; R06.00 Dyspnea  History:         Patient has no prior history of Echocardiogram examinations.                  Risk Factors:Current Smoker.  Sonographer:     Charmayne Sheer Referring Phys:  [id number] AMY N COX Diagnosing Phys: Isaias Cowman MD  Sonographer Comments: Technically difficult study due to poor echo windows. Image acquisition challenging due to patient body habitus. IMPRESSIONS  1. Left ventricular ejection fraction, by estimation, is 55 to 60%. The left ventricle has normal function. The left ventricle has no regional wall motion abnormalities. Left ventricular diastolic parameters are indeterminate.  2. Right ventricular  systolic function is normal. The right ventricular size is normal.  3. The mitral valve is normal in structure. Trivial mitral valve regurgitation. No evidence of mitral stenosis.  4. The aortic valve is normal in structure. Aortic valve regurgitation is not visualized. No aortic stenosis is present.  5. The inferior vena cava is normal in size with greater than 50% respiratory variability, suggesting right atrial pressure of 3 mmHg. FINDINGS  Left Ventricle: Left ventricular ejection fraction, by estimation, is 55 to 60%. The  left ventricle has normal function. The left ventricle has no regional wall motion abnormalities. The left ventricular internal cavity size was normal in size. There is  no left ventricular hypertrophy. Left ventricular diastolic parameters are indeterminate. Right Ventricle: The right ventricular size is normal. No increase in right ventricular wall thickness. Right ventricular systolic function is normal. Left Atrium: Left atrial size was normal in size. Right Atrium: Right atrial size was normal in size. Pericardium: There is no evidence of pericardial effusion. Mitral Valve: The mitral valve is normal in structure. Trivial mitral valve regurgitation. No evidence of mitral valve stenosis. Tricuspid Valve: The tricuspid valve is normal in structure. Tricuspid valve regurgitation is trivial. No evidence of tricuspid stenosis. Aortic Valve: The aortic valve is normal in structure. Aortic valve regurgitation is not visualized. No aortic stenosis is present. Aortic valve mean gradient measures 3.0 mmHg. Aortic valve peak gradient measures 6.0 mmHg. Aortic valve area, by VTI measures 2.54 cm. Pulmonic Valve: The pulmonic valve was normal in structure. Pulmonic valve regurgitation is not visualized. No evidence of pulmonic stenosis. Aorta: The aortic root is normal in size and structure. Venous: The inferior vena cava is normal in size with greater than 50% respiratory variability, suggesting  right atrial pressure of 3 mmHg. IAS/Shunts: No atrial level shunt detected by color flow Doppler.  LEFT VENTRICLE PLAX 2D LVIDd:         5.00 cm   Diastology LVIDs:         3.50 cm   LV e' medial:    9.90 cm/s LV PW:         1.30 cm   LV E/e' medial:  10.3 LV IVS:        0.80 cm   LV e' lateral:   16.30 cm/s LVOT diam:     2.00 cm   LV E/e' lateral: 6.3 LV SV:         55 LV SV Index:   26 LVOT Area:     3.14 cm  RIGHT VENTRICLE RV Basal diam:  2.60 cm LEFT ATRIUM             Index LA diam:        3.20 cm 1.51 cm/m LA Vol (A2C):   49.1 ml 23.10 ml/m LA Vol (A4C):   48.7 ml 22.91 ml/m LA Biplane Vol: 50.8 ml 23.90 ml/m  AORTIC VALVE                    PULMONIC VALVE AV Area (Vmax):    2.41 cm     PV Vmax:       0.97 m/s AV Area (Vmean):   2.28 cm     PV Vmean:      64.500 cm/s AV Area (VTI):     2.54 cm     PV VTI:        0.144 m AV Vmax:           122.00 cm/s  PV Peak grad:  3.7 mmHg AV Vmean:          85.900 cm/s  PV Mean grad:  2.0 mmHg AV VTI:            0.218 m AV Peak Grad:      6.0 mmHg AV Mean Grad:      3.0 mmHg LVOT Vmax:         93.40 cm/s LVOT Vmean:        62.300 cm/s LVOT VTI:  0.176 m LVOT/AV VTI ratio: 0.81  AORTA Ao Root diam: 2.80 cm MITRAL VALVE                TRICUSPID VALVE MV Area (PHT): 4.26 cm     TR Peak grad:   27.5 mmHg MV Decel Time: 178 msec     TR Vmax:        262.00 cm/s MV E velocity: 102.00 cm/s MV A velocity: 65.50 cm/s   SHUNTS MV E/A ratio:  1.56         Systemic VTI:  0.18 m                             Systemic Diam: 2.00 cm Isaias Cowman MD Electronically signed by Isaias Cowman MD Signature Date/Time: 09/23/2022/1:28:00 PM    Final    CT Angio Chest PE W/Cm &/Or Wo Cm  Result Date: 09/22/2022 CLINICAL DATA:  Chest pain EXAM: CT ANGIOGRAPHY CHEST WITH CONTRAST TECHNIQUE: Multidetector CT imaging of the chest was performed using the standard protocol during bolus administration of intravenous contrast. Multiplanar CT image reconstructions and MIPs  were obtained to evaluate the vascular anatomy. RADIATION DOSE REDUCTION: This exam was performed according to the departmental dose-optimization program which includes automated exposure control, adjustment of the mA and/or kV according to patient size and/or use of iterative reconstruction technique. CONTRAST:  129mL OMNIPAQUE IOHEXOL 350 MG/ML SOLN COMPARISON:  Chest radiographs including the study done earlier today FINDINGS: Cardiovascular: There is homogeneous enhancement in thoracic aorta. There are no intraluminal filling defects in pulmonary artery branches. Mediastinum/Nodes: No significant lymphadenopathy seen. Lungs/Pleura: There is no focal pulmonary consolidation. Small linear densities are seen in right middle lobe and left lower lobe. In image 83 of series 5, there is 7 mm nodular density in the anterior left lower lobe. There is paucity of lung markings in the anterior left lower lobe, possibly focal emphysema. In image 25, there is focal area of bronchial ectasia in medial left upper lobe. There is no pleural effusion or pneumothorax. Upper Abdomen: No acute findings are seen in upper abdomen. Musculoskeletal: No acute findings are seen. Review of the MIP images confirms the above findings. IMPRESSION: There is no evidence of pulmonary embolism. There is no evidence of thoracic aortic dissection. There is no focal pulmonary consolidation. There is 7 mm nodular density in left lower lobe which may be part of subsegmental atelectasis or scarring or lung nodule such as granuloma or neoplasm. Non-contrast chest CT at 6-12 months is recommended. If the nodule is stable at time of repeat CT, then future CT at 18-24 months (from today's scan) is considered optional for low-risk patients, but is recommended for high-risk patients. This recommendation follows the consensus statement: Guidelines for Management of Incidental Pulmonary Nodules Detected on CT Images: From the Fleischner Society 2017; Radiology  2017; 284:228-243. There is small area of bronchiectasis in medial left upper lobe. Electronically Signed   By: Elmer Picker M.D.   On: 09/22/2022 13:07   DG Chest 2 View  Result Date: 09/22/2022 CLINICAL DATA:  56 year old female with history of chest pain. EXAM: CHEST - 2 VIEW COMPARISON:  Chest x-ray 10/06/2021. FINDINGS: Lung volumes are normal. No consolidative airspace disease. No pleural effusions. No pneumothorax. No pulmonary nodule or mass noted. Pulmonary vasculature and the cardiomediastinal silhouette are within normal limits. IMPRESSION: No radiographic evidence of acute cardiopulmonary disease. Electronically Signed   By: Mauri Brooklyn.D.  On: 09/22/2022 10:24    Microbiology: Results for orders placed or performed during the hospital encounter of 10/05/21  Resp Panel by RT-PCR (Flu A&B, Covid) Nasopharyngeal Swab     Status: Abnormal   Collection Time: 10/04/21  5:30 PM   Specimen: Nasopharyngeal Swab; Nasopharyngeal(NP) swabs in vial transport medium  Result Value Ref Range Status   SARS Coronavirus 2 by RT PCR NEGATIVE NEGATIVE Final    Comment: (NOTE) SARS-CoV-2 target nucleic acids are NOT DETECTED.  The SARS-CoV-2 RNA is generally detectable in upper respiratory specimens during the acute phase of infection. The lowest concentration of SARS-CoV-2 viral copies this assay can detect is 138 copies/mL. A negative result does not preclude SARS-Cov-2 infection and should not be used as the sole basis for treatment or other patient management decisions. A negative result may occur with  improper specimen collection/handling, submission of specimen other than nasopharyngeal swab, presence of viral mutation(s) within the areas targeted by this assay, and inadequate number of viral copies(<138 copies/mL). A negative result must be combined with clinical observations, patient history, and epidemiological information. The expected result is Negative.  Fact Sheet  for Patients:  BloggerCourse.com  Fact Sheet for Healthcare Providers:  SeriousBroker.it  This test is no t yet approved or cleared by the [place] FDA and  has been authorized for detection and/or diagnosis of SARS-CoV-2 by FDA under an Emergency Use Authorization (EUA). This EUA will remain  in effect (meaning this test can be used) for the duration of the COVID-19 declaration under Section 564(b)(1) of the Act, 21 U.S.C.section 360bbb-3(b)(1), unless the authorization is terminated  or revoked sooner.       Influenza A by PCR POSITIVE (A) NEGATIVE Final   Influenza B by PCR NEGATIVE NEGATIVE Final    Comment: (NOTE) The Xpert Xpress SARS-CoV-2/FLU/RSV plus assay is intended as an aid in the diagnosis of influenza from Nasopharyngeal swab specimens and should not be used as a sole basis for treatment. Nasal washings and aspirates are unacceptable for Xpert Xpress SARS-CoV-2/FLU/RSV testing.  Fact Sheet for Patients: BloggerCourse.com  Fact Sheet for Healthcare Providers: SeriousBroker.it  This test is not yet approved or cleared by the [place] FDA and has been authorized for detection and/or diagnosis of SARS-CoV-2 by FDA under an Emergency Use Authorization (EUA). This EUA will remain in effect (meaning this test can be used) for the duration of the COVID-19 declaration under Section 564(b)(1) of the Act, 21 U.S.C. section 360bbb-3(b)(1), unless the authorization is terminated or revoked.  Performed at [facility], [address] [place], [place] [address]     Labs: CBC: Recent Labs  Lab 09/22/22 1009 09/23/22 0451 09/24/22 0559  WBC 13.6* 10.1 9.7  HGB 13.6 12.0 12.6  HCT 42.2 36.2 38.1  MCV 91.3 90.7 90.1  PLT 264 234 248   Basic Metabolic Panel: Recent Labs  Lab 09/22/22 1009 09/23/22 0451 09/24/22 0559  NA 140 140 141  K 3.6  3.3* 3.7  CL 102 108 113*  CO2 27 27 22   GLUCOSE 108* 99 92  BUN 9 8 11   CREATININE 0.73 0.70 0.72  CALCIUM 9.3 8.8* 8.7*  MG 2.2  --  2.4  PHOS 4.5  --  4.8*   Liver Function Tests: No results for input(s): "AST", "ALT", "ALKPHOS", "BILITOT", "PROT", "ALBUMIN" in the last 168 hours. CBG: No results for input(s): "GLUCAP" in the last 168 hours.  Discharge time spent: greater than 30 minutes.  Signed: , MD [facility] 09/24/2022

## 2022-09-24 NOTE — Consult Note (Signed)
Called [facility]. Asked them to cancel ticagrelor and fill plavix. Pt was having SOB on ticagrelor and Dr. Juliann Pares switched to plavix.   Paschal Dopp, PharmD, BCPS

## 2022-09-25 LAB — CARDIAC CATHETERIZATION: Cath EF Quantitative: 55 %

## 2022-09-26 ENCOUNTER — Encounter: Payer: Self-pay | Admitting: Internal Medicine

## 2022-09-26 LAB — LIPOPROTEIN A (LPA): Lipoprotein (a): 272.4 nmol/L — ABNORMAL HIGH (ref <75.0)

## 2022-09-28 ENCOUNTER — Other Ambulatory Visit: Payer: Self-pay

## 2022-09-28 ENCOUNTER — Emergency Department: Payer: Medicaid Other

## 2022-09-28 DIAGNOSIS — R079 Chest pain, unspecified: Secondary | ICD-10-CM | POA: Diagnosis present

## 2022-09-28 LAB — CBC
HCT: 41.5 % (ref 36.0–46.0)
Hemoglobin: 13.5 g/dL (ref 12.0–15.0)
MCH: 29.7 pg (ref 26.0–34.0)
MCHC: 32.5 g/dL (ref 30.0–36.0)
MCV: 91.2 fL (ref 80.0–100.0)
Platelets: 274 10*3/uL (ref 150–400)
RBC: 4.55 MIL/uL (ref 3.87–5.11)
RDW: 13.6 % (ref 11.5–15.5)
WBC: 12 10*3/uL — ABNORMAL HIGH (ref 4.0–10.5)
nRBC: 0 % (ref 0.0–0.2)

## 2022-09-28 LAB — BASIC METABOLIC PANEL
Anion gap: 9 (ref 5–15)
BUN: 10 mg/dL (ref 6–20)
CO2: 24 mmol/L (ref 22–32)
Calcium: 9.5 mg/dL (ref 8.9–10.3)
Chloride: 107 mmol/L (ref 98–111)
Creatinine, Ser: 0.76 mg/dL (ref 0.44–1.00)
GFR, Estimated: >60 mL/min (ref >60)
Glucose, Bld: 108 mg/dL — ABNORMAL HIGH (ref 70–99)
Potassium: 4 mmol/L (ref 3.5–5.1)
Sodium: 140 mmol/L (ref 135–145)

## 2022-09-28 LAB — TROPONIN I (HIGH SENSITIVITY): Troponin I (High Sensitivity): 78 ng/L — ABNORMAL HIGH (ref <18)

## 2022-09-28 MED ORDER — ONDANSETRON HCL 4 MG/2ML IJ SOLN
4.0000 mg | Freq: Once | INTRAMUSCULAR | Status: AC
  Administered 2022-09-28: 4 mg via INTRAVENOUS
  Filled 2022-09-28 (×2): qty 2

## 2022-09-28 NOTE — ED Triage Notes (Signed)
Pt comes from home via POV c/o chest pain. Pt states midchest pain started a few hrs ago and now feels like its "running from front to back". pt had stent placed on Monday. Pain does not radiate.

## 2022-09-29 ENCOUNTER — Emergency Department
Admission: EM | Admit: 2022-09-29 | Discharge: 2022-09-29 | Disposition: A | Discharge status: Home / Self Care | Payer: Medicaid Other | Attending: Emergency Medicine | Admitting: Emergency Medicine

## 2022-09-29 DIAGNOSIS — R079 Chest pain, unspecified: Secondary | ICD-10-CM

## 2022-09-29 LAB — TROPONIN I (HIGH SENSITIVITY): Troponin I (High Sensitivity): 65 ng/L — ABNORMAL HIGH (ref <18)

## 2022-09-29 MED ORDER — FENTANYL CITRATE PF 50 MCG/ML IJ SOSY
50.0000 ug | PREFILLED_SYRINGE | Freq: Once | INTRAMUSCULAR | Status: AC
  Administered 2022-09-29: 50 ug via INTRAVENOUS
  Filled 2022-09-29: qty 1

## 2022-09-29 NOTE — ED Provider Notes (Signed)
[facility] Provider Note    Event Date/Time   First MD Initiated Contact with Patient 09/29/22 [phone]     (approximate)   History   Chest Pain   HPI  Jeanette Ball is a 56 y.o. female whose medical history is most notable for NSTEMI within the last week and catheterization with stent placement to mid circumflex by Dr. Juliann Pares.  She said that she was discharged a couple of days ago and has been doing okay but tonight had some worsening chest pain.  She said that she is still reading through all the information she was given.  She was worried because of the degree of pain she was having earlier but it has resolved.  She just wanted to get checked out to make sure everything is okay.  Pain does not radiate although it feels a little bit like it was going from the front of her chest to the back.  No shortness of breath, no recent fever, no syncope.  She is currently asymptomatic.       Physical Exam   Triage Vital Signs: ED Triage Vitals  Enc Vitals Group     BP 09/28/22 2231 (!) 168/94     Pulse Rate 09/28/22 2231 72     Resp 09/28/22 2231 18     Temp 09/28/22 2231 97.8 F (36.6 C)     Temp Source 09/28/22 2231 Oral     SpO2 09/28/22 2231 99 %     Weight --      Height --      Head Circumference --      Peak Flow --      Pain Score 09/28/22 2232 10     Pain Loc --      Pain Edu? --      Excl. in GC? --     Most recent vital signs: Vitals:   09/28/22 2231 09/29/22 0343  BP: (!) 168/94 126/84  Pulse: 72 61  Resp: 18 16  Temp: 97.8 F (36.6 C) 98.1 F (36.7 C)  SpO2: 99% 99%     General: Awake, no distress.  Well-appearing. CV:  Good peripheral perfusion.  Normal heart sounds without murmur, rub, nor gallop.  Regular rate and rhythm. Resp:  Normal effort.  Lungs are clear to auscultation. Abd:  No distention.  No tenderness to palpation. Other:  Appropriate mood and affect, no focal neurological deficits.   ED Results /  Procedures / Treatments   Labs (all labs ordered are listed, but only abnormal results are displayed) Labs Reviewed  BASIC METABOLIC PANEL - Abnormal; Notable for the following components:      Result Value   Glucose, Bld 108 (*)    All other components within normal limits  CBC - Abnormal; Notable for the following components:   WBC 12.0 (*)    All other components within normal limits  TROPONIN I (HIGH SENSITIVITY) - Abnormal; Notable for the following components:   Troponin I (High Sensitivity) 78 (*)    All other components within normal limits  TROPONIN I (HIGH SENSITIVITY) - Abnormal; Notable for the following components:   Troponin I (High Sensitivity) 65 (*)    All other components within normal limits  POC URINE PREG, ED     EKG  ED ECG REPORT I, Loleta Rose, the attending physician, personally viewed and interpreted this ECG.  Date: 09/28/2022 EKG Time: 22:28 Rate: 77 Rhythm: normal sinus rhythm QRS Axis: normal Intervals: normal ST/T  Wave abnormalities: normal Narrative Interpretation: no evidence of acute ischemia    RADIOLOGY I viewed and interpreted the patient's two-view chest x-ray.  No evidence of pneumonia, widened mediastinum, nor other acute abnormality.  I also read the radiologist's report, which confirmed no acute findings.    PROCEDURES:  Critical Care performed: No  Procedures   MEDICATIONS ORDERED IN ED: Medications  ondansetron (ZOFRAN) injection 4 mg (4 mg Intravenous Given 09/28/22 2343)  fentaNYL (SUBLIMAZE) injection 50 mcg (50 mcg Intravenous Given 09/29/22 0036)     IMPRESSION / MDM / ASSESSMENT AND PLAN / ED COURSE  I reviewed the triage vital signs and the nursing notes.                              Differential diagnosis includes, but is not limited to, angina, reperfusion pain, rupture of left ventricular free wall or septum, papillary muscle necrosis, Dressler's syndrome, ACS, musculoskeletal pain.  Patient's  presentation is most consistent with acute presentation with potential threat to life or bodily function.  Labs/studies ordered: EKG, two-view chest x-ray, BMP, CBC, high-sensitivity troponin x 2.  Vital signs are stable and within normal limits.  No ischemia on EKG and as documented above, chest x-ray is normal.  Basic metabolic panel and CBC are normal other than a mild leukocytosis.  High-sensitivity troponin initially was 78 but the repeat is 65 and this is compared to 6 days ago when her troponin maxed out at about 5000.  I am reassured by the results and the patient is currently asymptomatic.  She is also reassured.  I considered hospitalization but at this point there does not appear to be any severe complication of her MI and PCI and she is comfortable with the plan for discharge and outpatient follow-up with Dr. Clayborn Bigness.  I will send him a message through the computer system and to make him aware but she can call on the next business day to schedule follow-up appointment.  I gave strict return precautions and she agrees with the plan.      FINAL CLINICAL IMPRESSION(S) / ED DIAGNOSES   Final diagnoses:  Chest pain, unspecified type     Rx / DC Orders   ED Discharge Orders     None        Note:  This document was prepared using Dragon voice recognition software and may include unintentional dictation errors.   Hinda Kehr, MD 09/29/22 [phone]

## 2022-09-29 NOTE — Discharge Instructions (Addendum)
As we discussed, your evaluation was reassuring.  Having some chest pain after your heart attack and catheterization is to be expected and fortunately your EKG and labs were reassuring tonight.  Please follow-up with Dr. Juliann Pares with a phone call on the next business day and talk to him or his nurse about the symptoms.  We also sent a message to him through the computer system so that he would be aware of your visit.  Return to the emergency department if you develop new or worsening symptoms that concern you.

## 2022-11-27 ENCOUNTER — Encounter: Payer: PRIVATE HEALTH INSURANCE | Attending: Internal Medicine | Admitting: *Deleted

## 2022-11-27 DIAGNOSIS — I252 Old myocardial infarction: Secondary | ICD-10-CM | POA: Insufficient documentation

## 2022-11-27 DIAGNOSIS — I214 Non-ST elevation (NSTEMI) myocardial infarction: Secondary | ICD-10-CM

## 2022-11-27 DIAGNOSIS — Z48812 Encounter for surgical aftercare following surgery on the circulatory system: Secondary | ICD-10-CM | POA: Insufficient documentation

## 2022-11-27 DIAGNOSIS — Z955 Presence of coronary angioplasty implant and graft: Secondary | ICD-10-CM | POA: Insufficient documentation

## 2022-11-27 NOTE — Progress Notes (Signed)
Initial phone call completed. Diagnosis can be found in [facility] 12/17. EP Orientation scheduled for Thursday 2/22 at 9:30.

## 2022-11-28 VITALS — Ht 68.5 in | Wt 190.5 lb

## 2022-11-28 DIAGNOSIS — I214 Non-ST elevation (NSTEMI) myocardial infarction: Secondary | ICD-10-CM

## 2022-11-28 DIAGNOSIS — Z48812 Encounter for surgical aftercare following surgery on the circulatory system: Secondary | ICD-10-CM | POA: Diagnosis not present

## 2022-11-28 DIAGNOSIS — I252 Old myocardial infarction: Secondary | ICD-10-CM | POA: Diagnosis not present

## 2022-11-28 DIAGNOSIS — Z955 Presence of coronary angioplasty implant and graft: Secondary | ICD-10-CM

## 2022-11-28 NOTE — Patient Instructions (Signed)
Patient Instructions  Patient Details  Name: Jeanette Ball MRN: [id number] Date of Birth: 1965/11/12 Referring Provider:  Yolonda Kida, MD  Below are your personal goals for exercise, nutrition, and risk factors. Our goal is to help you stay on track towards obtaining and maintaining these goals. We will be discussing your progress on these goals with you throughout the program.  Initial Exercise Prescription:  Initial Exercise Prescription - 11/28/22 1300       Date of Initial Exercise RX and Referring Provider   Date 11/28/22    Referring Provider Dr. Lujean Amel, MD      Oxygen   Maintain Oxygen Saturation 88% or higher      Treadmill   MPH 2.4    Grade 1    Minutes 15    METs 3.17      Recumbant Bike   Level 3    RPM 50    Watts 54    Minutes 15    METs 3.97      Recumbant Elliptical   Level 1.5    RPM 50    Minutes 15    METs 3.97      REL-XR   Level 3    Speed 50    Minutes 15    METs 3.97      Prescription Details   Frequency (times per week) 3    Duration Progress to 30 minutes of continuous aerobic without signs/symptoms of physical distress      Intensity   THRR 40-80% of Max Heartrate 107-146    Ratings of Perceived Exertion 11-13    Perceived Dyspnea 0-4      Progression   Progression Continue to progress workloads to maintain intensity without signs/symptoms of physical distress.      Resistance Training   Training Prescription Yes    Weight 3 lb    Reps 10-15             Exercise Goals: Frequency: Be able to perform aerobic exercise two to three times per week in program working toward 2-5 days per week of home exercise.  Intensity: Work with a perceived exertion of 11 (fairly light) - 15 (hard) while following your exercise prescription.  We will make changes to your prescription with you as you progress through the program.   Duration: Be able to do 30 to 45 minutes of continuous aerobic exercise in addition to a 5  minute warm-up and a 5 minute cool-down routine.   Nutrition Goals: Your personal nutrition goals will be established when you do your nutrition analysis with the dietician.  The following are general nutrition guidelines to follow: Cholesterol < 214m/day Sodium < 15053mday Fiber: Women over 50 yrs - 21 grams per day  Personal Goals:  Personal Goals and Risk Factors at Admission - 11/27/22 0954       Core Components/Risk Factors/Patient Goals on Admission    Weight Management Yes;Weight Loss    Intervention Weight Management: Develop a combined nutrition and exercise program designed to reach desired caloric intake, while maintaining appropriate intake of nutrient and fiber, sodium and fats, and appropriate energy expenditure required for the weight goal.;Weight Management: Provide education and appropriate resources to help participant work on and attain dietary goals.;Weight Management/Obesity: Establish reasonable short term and long term weight goals.    Expected Outcomes Short Term: Continue to assess and modify interventions until short term weight is achieved;Long Term: Adherence to nutrition and physical activity/exercise program aimed toward attainment of  established weight goal;Weight Loss: Understanding of general recommendations for a balanced deficit meal plan, which promotes 1-2 lb weight loss per week and includes a negative energy balance of [phone] kcal/d;Understanding recommendations for meals to include 15-35% energy as protein, 25-35% energy from fat, 35-60% energy from carbohydrates, less than 249m of dietary cholesterol, 20-35 gm of total fiber daily;Understanding of distribution of calorie intake throughout the day with the consumption of 4-5 meals/snacks    Tobacco Cessation Yes   Quit 09/22/22   Intervention Assist the participant in steps to quit. Provide individualized education and counseling about committing to Tobacco Cessation, relapse prevention, and  pharmacological support that can be provided by physician.;OAdvice worker assist with locating and accessing local/national Quit Smoking programs, and support quit date choice.    Expected Outcomes Short Term: Will demonstrate readiness to quit, by selecting a quit date.;Short Term: Will quit all tobacco product use, adhering to prevention of relapse plan.;Long Term: Complete abstinence from all tobacco products for at least 12 months from quit date.    Hypertension Yes    Intervention Provide education on lifestyle modifcations including regular physical activity/exercise, weight management, moderate sodium restriction and increased consumption of fresh fruit, vegetables, and low fat dairy, alcohol moderation, and smoking cessation.;Monitor prescription use compliance.    Expected Outcomes Short Term: Continued assessment and intervention until BP is < 140/956mHG in hypertensive participants. < 130/8023mG in hypertensive participants with diabetes, heart failure or chronic kidney disease.;Long Term: Maintenance of blood pressure at goal levels.    Lipids Yes    Intervention Provide education and support for participant on nutrition & aerobic/resistive exercise along with prescribed medications to achieve LDL <29m45mDL >40mg69m Expected Outcomes Short Term: Participant states understanding of desired cholesterol values and is compliant with medications prescribed. Participant is following exercise prescription and nutrition guidelines.;Long Term: Cholesterol controlled with medications as prescribed, with individualized exercise RX and with personalized nutrition plan. Value goals: LDL < 29mg,51m > 40 mg.            Exercise Goals and Review:  Exercise Goals     Row Name 11/28/22 1239             Exercise Goals   Increase Physical Activity Yes       Intervention Provide advice, education, support and counseling about physical activity/exercise needs.;Develop an  individualized exercise prescription for aerobic and resistive training based on initial evaluation findings, risk stratification, comorbidities and participant's personal goals.       Expected Outcomes Short Term: Attend rehab on a regular basis to increase amount of physical activity.;Long Term: Exercising regularly at least 3-5 days a week.;Long Term: Add in home exercise to make exercise part of routine and to increase amount of physical activity.       Increase Strength and Stamina Yes       Intervention Develop an individualized exercise prescription for aerobic and resistive training based on initial evaluation findings, risk stratification, comorbidities and participant's personal goals.;Provide advice, education, support and counseling about physical activity/exercise needs.       Expected Outcomes Short Term: Increase workloads from initial exercise prescription for resistance, speed, and METs.;Short Term: Perform resistance training exercises routinely during rehab and add in resistance training at home;Long Term: Improve cardiorespiratory fitness, muscular endurance and strength as measured by increased METs and functional capacity (6MWT)       Able to understand and use rate of perceived exertion (RPE) scale Yes  Intervention Provide education and explanation on how to use RPE scale       Expected Outcomes Short Term: Able to use RPE daily in rehab to express subjective intensity level;Long Term:  Able to use RPE to guide intensity level when exercising independently       Able to understand and use Dyspnea scale Yes       Intervention Provide education and explanation on how to use Dyspnea scale       Expected Outcomes Short Term: Able to use Dyspnea scale daily in rehab to express subjective sense of shortness of breath during exertion;Long Term: Able to use Dyspnea scale to guide intensity level when exercising independently       Knowledge and understanding of Target Heart Rate Range  (THRR) Yes       Intervention Provide education and explanation of THRR including how the numbers were predicted and where they are located for reference       Expected Outcomes Short Term: Able to state/look up THRR;Short Term: Able to use daily as guideline for intensity in rehab;Long Term: Able to use THRR to govern intensity when exercising independently       Able to check pulse independently Yes       Intervention Provide education and demonstration on how to check pulse in carotid and radial arteries.;Review the importance of being able to check your own pulse for safety during independent exercise       Expected Outcomes Short Term: Able to explain why pulse checking is important during independent exercise;Long Term: Able to check pulse independently and accurately       Understanding of Exercise Prescription Yes       Intervention Provide education, explanation, and written materials on patient's individual exercise prescription       Expected Outcomes Short Term: Able to explain program exercise prescription;Long Term: Able to explain home exercise prescription to exercise independently

## 2022-11-28 NOTE — Progress Notes (Signed)
Cardiac Individual Treatment Plan  Patient Details  Name: Jeanette Ball MRN: [id number] Date of Birth: 04/13/1966 Referring Provider:   Flowsheet Row Cardiac Rehab from 11/28/2022 in [facility] Cardiac and Pulmonary Rehab  Referring Provider Dr. Lujean Amel, MD       Initial Encounter Date:  Flowsheet Row Cardiac Rehab from 11/28/2022 in [facility] Cardiac and Pulmonary Rehab  Date 11/28/22       Visit Diagnosis: NSTEMI (non-ST elevated myocardial infarction) [facility])  Status post coronary artery stent placement  Patient's Home Medications on Admission:  Current Outpatient Medications:    ALPRAZolam (XANAX) 0.5 MG tablet, Take by mouth., Disp: , Rfl:    aspirin 81 MG chewable tablet, Chew 1 tablet (81 mg total) by mouth daily. (Patient not taking: Reported on 11/27/2022), Disp: 30 tablet, Rfl: 0   clopidogrel (PLAVIX) 75 MG tablet, Take 1 tablet (75 mg total) by mouth daily., Disp: 30 tablet, Rfl: 0   losartan (COZAAR) 50 MG tablet, Take 1 tablet (50 mg total) by mouth daily., Disp: 30 tablet, Rfl: 0   metoprolol tartrate (LOPRESSOR) 25 MG tablet, Take 1 tablet (25 mg total) by mouth 2 (two) times daily. (Patient not taking: Reported on 11/27/2022), Disp: 60 tablet, Rfl: 0   nicotine (NICODERM CQ - DOSED IN MG/24 HOURS) 21 mg/24hr patch, Place 1 patch (21 mg total) onto the skin daily as needed (nicotine craving). (Patient not taking: Reported on 11/27/2022), Disp: 28 patch, Rfl: 0   rosuvastatin (CRESTOR) 40 MG tablet, Take 1 tablet (40 mg total) by mouth daily., Disp: 30 tablet, Rfl: 0  Past Medical History: Past Medical History:  Diagnosis Date   Alcohol use    Tobacco use     Tobacco Use: Social History   Tobacco Use  Smoking Status Former   Packs/day: 1.00   Years: 7.00   Total pack years: 7.00   Types: Cigarettes   Start date: 09/22/2022  Smokeless Tobacco Never    Labs: Review Flowsheet       Latest Ref Rng & Units 09/23/2022  Labs for ITP Cardiac and Pulmonary  Rehab  Cholestrol 0 - 200 mg/dL 220   LDL (calc) 0 - 99 mg/dL 146   HDL-C >40 mg/dL 50   Trlycerides <150 mg/dL 118      Exercise Target Goals: Exercise Program Goal: Individual exercise prescription set using results from initial 6 min walk test and THRR while considering  patient's activity barriers and safety.   Exercise Prescription Goal: Initial exercise prescription builds to 30-45 minutes a day of aerobic activity, 2-3 days per week.  Home exercise guidelines will be given to patient during program as part of exercise prescription that the participant will acknowledge.   Education: Aerobic Exercise: - Group verbal and visual presentation on the components of exercise prescription. Introduces F.I.T.T principle from ACSM for exercise prescriptions.  Reviews F.I.T.T. principles of aerobic exercise including progression. Written material given at graduation.   Education: Resistance Exercise: - Group verbal and visual presentation on the components of exercise prescription. Introduces F.I.T.T principle from ACSM for exercise prescriptions  Reviews F.I.T.T. principles of resistance exercise including progression. Written material given at graduation.    Education: Exercise & Equipment Safety: - Individual verbal instruction and demonstration of equipment use and safety with use of the equipment. Flowsheet Row Cardiac Rehab from 11/28/2022 in [facility] Cardiac and Pulmonary Rehab  Date 11/28/22  Educator NT  Instruction Review Code 1- Verbalizes Understanding       Education: Exercise Physiology &  General Exercise Guidelines: - Group verbal and written instruction with models to review the exercise physiology of the cardiovascular system and associated critical values. Provides general exercise guidelines with specific guidelines to those with heart or lung disease.    Education: Flexibility, Balance, Mind/Body Relaxation: - Group verbal and visual presentation with interactive  activity on the components of exercise prescription. Introduces F.I.T.T principle from ACSM for exercise prescriptions. Reviews F.I.T.T. principles of flexibility and balance exercise training including progression. Also discusses the mind body connection.  Reviews various relaxation techniques to help reduce and manage stress (i.e. Deep breathing, progressive muscle relaxation, and visualization). Balance handout provided to take home. Written material given at graduation.   Activity Barriers & Risk Stratification:  Activity Barriers & Cardiac Risk Stratification - 11/28/22 1304       Activity Barriers & Cardiac Risk Stratification   Activity Barriers None    Cardiac Risk Stratification Moderate             6 Minute Walk:  6 Minute Walk     Row Name 11/28/22 1253         6 Minute Walk   Phase Initial     Distance 1415 feet     Walk Time 6 minutes     # of Rest Breaks 0     MPH 2.68     METS 3.97     RPE 9     Perceived Dyspnea  0     VO2 Peak 13.88     Symptoms No     Resting HR 68 bpm     Resting BP 126/68     Resting Oxygen Saturation  99 %     Exercise Oxygen Saturation  during 6 min walk 98 %     Max Ex. HR 94 bpm     Max Ex. BP 144/82     2 Minute Post BP 124/72              Oxygen Initial Assessment:   Oxygen Re-Evaluation:   Oxygen Discharge (Final Oxygen Re-Evaluation):   Initial Exercise Prescription:  Initial Exercise Prescription - 11/28/22 1300       Date of Initial Exercise RX and Referring Provider   Date 11/28/22    Referring Provider Dr. Lujean Amel, MD      Oxygen   Maintain Oxygen Saturation 88% or higher      Treadmill   MPH 2.4    Grade 1    Minutes 15    METs 3.17      Recumbant Bike   Level 3    RPM 50    Watts 54    Minutes 15    METs 3.97      Recumbant Elliptical   Level 1.5    RPM 50    Minutes 15    METs 3.97      REL-XR   Level 3    Speed 50    Minutes 15    METs 3.97      Prescription  Details   Frequency (times per week) 3    Duration Progress to 30 minutes of continuous aerobic without signs/symptoms of physical distress      Intensity   THRR 40-80% of Max Heartrate 107-146    Ratings of Perceived Exertion 11-13    Perceived Dyspnea 0-4      Progression   Progression Continue to progress workloads to maintain intensity without signs/symptoms of physical distress.  Resistance Training   Training Prescription Yes    Weight 3 lb    Reps 10-15             Perform Capillary Blood Glucose checks as needed.  Exercise Prescription Changes:   Exercise Prescription Changes     Row Name 11/28/22 1300             Response to Exercise   Blood Pressure (Admit) 126/68       Blood Pressure (Exercise) 144/82       Blood Pressure (Exit) 124/72       Heart Rate (Admit) 68 bpm       Heart Rate (Exercise) 94 bpm       Heart Rate (Exit) 69 bpm       Oxygen Saturation (Admit) 99 %       Oxygen Saturation (Exercise) 98 %       Rating of Perceived Exertion (Exercise) 9       Perceived Dyspnea (Exercise) 0       Symptoms None       Comments 6MWT Results                Exercise Comments:   Exercise Goals and Review:   Exercise Goals     Row Name 11/28/22 1239             Exercise Goals   Increase Physical Activity Yes       Intervention Provide advice, education, support and counseling about physical activity/exercise needs.;Develop an individualized exercise prescription for aerobic and resistive training based on initial evaluation findings, risk stratification, comorbidities and participant's personal goals.       Expected Outcomes Short Term: Attend rehab on a regular basis to increase amount of physical activity.;Long Term: Exercising regularly at least 3-5 days a week.;Long Term: Add in home exercise to make exercise part of routine and to increase amount of physical activity.       Increase Strength and Stamina Yes       Intervention  Develop an individualized exercise prescription for aerobic and resistive training based on initial evaluation findings, risk stratification, comorbidities and participant's personal goals.;Provide advice, education, support and counseling about physical activity/exercise needs.       Expected Outcomes Short Term: Increase workloads from initial exercise prescription for resistance, speed, and METs.;Short Term: Perform resistance training exercises routinely during rehab and add in resistance training at home;Long Term: Improve cardiorespiratory fitness, muscular endurance and strength as measured by increased METs and functional capacity (6MWT)       Able to understand and use rate of perceived exertion (RPE) scale Yes       Intervention Provide education and explanation on how to use RPE scale       Expected Outcomes Short Term: Able to use RPE daily in rehab to express subjective intensity level;Long Term:  Able to use RPE to guide intensity level when exercising independently       Able to understand and use Dyspnea scale Yes       Intervention Provide education and explanation on how to use Dyspnea scale       Expected Outcomes Short Term: Able to use Dyspnea scale daily in rehab to express subjective sense of shortness of breath during exertion;Long Term: Able to use Dyspnea scale to guide intensity level when exercising independently       Knowledge and understanding of Target Heart Rate Range (THRR) Yes       Intervention Provide  education and explanation of THRR including how the numbers were predicted and where they are located for reference       Expected Outcomes Short Term: Able to state/look up THRR;Short Term: Able to use daily as guideline for intensity in rehab;Long Term: Able to use THRR to govern intensity when exercising independently       Able to check pulse independently Yes       Intervention Provide education and demonstration on how to check pulse in carotid and radial  arteries.;Review the importance of being able to check your own pulse for safety during independent exercise       Expected Outcomes Short Term: Able to explain why pulse checking is important during independent exercise;Long Term: Able to check pulse independently and accurately       Understanding of Exercise Prescription Yes       Intervention Provide education, explanation, and written materials on patient's individual exercise prescription       Expected Outcomes Short Term: Able to explain program exercise prescription;Long Term: Able to explain home exercise prescription to exercise independently                Exercise Goals Re-Evaluation :   Discharge Exercise Prescription (Final Exercise Prescription Changes):  Exercise Prescription Changes - 11/28/22 1300       Response to Exercise   Blood Pressure (Admit) 126/68    Blood Pressure (Exercise) 144/82    Blood Pressure (Exit) 124/72    Heart Rate (Admit) 68 bpm    Heart Rate (Exercise) 94 bpm    Heart Rate (Exit) 69 bpm    Oxygen Saturation (Admit) 99 %    Oxygen Saturation (Exercise) 98 %    Rating of Perceived Exertion (Exercise) 9    Perceived Dyspnea (Exercise) 0    Symptoms None    Comments 6MWT Results             Nutrition:  Target Goals: Understanding of nutrition guidelines, daily intake of sodium '1500mg'$ , cholesterol '200mg'$ , calories 30% from fat and 7% or less from saturated fats, daily to have 5 or more servings of fruits and vegetables.  Education: All About Nutrition: -Group instruction provided by verbal, written material, interactive activities, discussions, models, and posters to present general guidelines for heart healthy nutrition including fat, fiber, MyPlate, the role of sodium in heart healthy nutrition, utilization of the nutrition label, and utilization of this knowledge for meal planning. Follow up email sent as well. Written material given at graduation. Flowsheet Row Cardiac Rehab from  11/28/2022 in [facility] Cardiac and Pulmonary Rehab  Education need identified 11/28/22       Biometrics:  Pre Biometrics - 11/28/22 1304       Pre Biometrics   Height 5' 8.5" (1.74 m)    Weight 190 lb 8 oz (86.4 kg)    Waist Circumference 37 inches    Hip Circumference 42 inches    Waist to Hip Ratio 0.88 %    BMI (Calculated) 28.54    Single Leg Stand 16.7 seconds              Nutrition Therapy Plan and Nutrition Goals:   Nutrition Assessments:  MEDIFICTS Score Key: ?70 Need to make dietary changes  40-70 Heart Healthy Diet ? 40 Therapeutic Level Cholesterol Diet  Flowsheet Row Cardiac Rehab from 11/27/2022 in [facility] Cardiac and Pulmonary Rehab  Picture Your Plate Total Score on Admission 34      Picture Your Plate Scores: [id number] Unhealthy  dietary pattern with much room for improvement. 41-50 Dietary pattern unlikely to meet recommendations for good health and room for improvement. 51-60 More healthful dietary pattern, with some room for improvement.  >60 Healthy dietary pattern, although there may be some specific behaviors that could be improved.    Nutrition Goals Re-Evaluation:   Nutrition Goals Discharge (Final Nutrition Goals Re-Evaluation):   Psychosocial: Target Goals: Acknowledge presence or absence of significant depression and/or stress, maximize coping skills, provide positive support system. Participant is able to verbalize types and ability to use techniques and skills needed for reducing stress and depression.   Education: Stress, Anxiety, and Depression - Group verbal and visual presentation to define topics covered.  Reviews how body is impacted by stress, anxiety, and depression.  Also discusses healthy ways to reduce stress and to treat/manage anxiety and depression.  Written material given at graduation.   Education: Sleep Hygiene -Provides group verbal and written instruction about how sleep can affect your health.  Define sleep hygiene,  discuss sleep cycles and impact of sleep habits. Review good sleep hygiene tips.    Initial Review & Psychosocial Screening:  Initial Psych Review & Screening - 11/27/22 1002       Initial Review   Current issues with Current Anxiety/Panic;Current Depression;Current Stress Concerns    Comments Health concerns, replaying MI, noticing increased anxiety and not feeling like it can be controlled at times      [place]? Yes      Barriers   Psychosocial barriers to participate in program The patient should benefit from training in stress management and relaxation.      Screening Interventions   Interventions Encouraged to exercise;Provide feedback about the scores to participant;To provide support and resources with identified psychosocial needs    Expected Outcomes Short Term goal: Utilizing psychosocial counselor, staff and physician to assist with identification of specific Stressors or current issues interfering with healing process. Setting desired goal for each stressor or current issue identified.;Long Term Goal: Stressors or current issues are controlled or eliminated.;Short Term goal: Identification and review with participant of any Quality of Life or Depression concerns found by scoring the questionnaire.;Long Term goal: The participant improves quality of Life and PHQ9 Scores as seen by post scores and/or verbalization of changes             Quality of Life Scores:   Quality of Life - 11/27/22 1037       Quality of Life   Select Quality of Life      Quality of Life Scores   Health/Function Pre 9.4 %    Socioeconomic Pre 13.13 %    Psych/Spiritual Pre 22.29 %    Family Pre 19.8 %    GLOBAL Pre 14.31 %            Scores of 19 and below usually indicate a poorer quality of life in these areas.  A difference of  2-3 points is a clinically meaningful difference.  A difference of 2-3 points in the total score of the Quality of Life Index has  been associated with significant improvement in overall quality of life, self-image, physical symptoms, and general health in studies assessing change in quality of life.  PHQ-9: Review Flowsheet       11/28/2022  Depression screen PHQ 2/9  Decreased Interest 3  Down, Depressed, Hopeless 3  PHQ - 2 Score 6  Altered sleeping 2  Tired, decreased energy 3  Change in  appetite 3  Feeling bad or failure about yourself  3  Trouble concentrating 3  Moving slowly or fidgety/restless 3  Suicidal thoughts 0  PHQ-9 Score 23  Difficult doing work/chores Extremely dIfficult   Interpretation of Total Score  Total Score Depression Severity:  1-4 = Minimal depression, 5-9 = Mild depression, 10-14 = Moderate depression, 15-19 = Moderately severe depression, 20-27 = Severe depression   Psychosocial Evaluation and Intervention:  Psychosocial Evaluation - 11/27/22 1021       Psychosocial Evaluation & Interventions   Interventions Encouraged to exercise with the program and follow exercise prescription;Stress management education;Relaxation education    Comments Ward is coming to cardiac rehab post NSTEMI and stent. She reports the mental load has been more than she thought it would be. When its quiet, she finds herself replaying the her MI events and the "what ifs." She retired from [employer] in 2021 to help care for her grandson. She did go back part time with [employer] but does not know if she wants to return because the mental stress of it all. She is interested in [employer] and hopes maybe she can find something part time from home. She quit smoking the day of her heart attack because she is scared to start back. She wants to focus on her mental health and is very ready to start the program to work on caring for herself.    Expected Outcomes Short: attend cardiac rehab for education and exercise. Long: develop and maintain positive self care habits.    Continue Psychosocial  Services  Follow up required by staff             Psychosocial Re-Evaluation:   Psychosocial Discharge (Final Psychosocial Re-Evaluation):   Vocational Rehabilitation: Provide vocational rehab assistance to qualifying candidates.   Vocational Rehab Evaluation & Intervention:  Vocational Rehab - 11/27/22 0957       Initial Vocational Rehab Evaluation & Intervention   Assessment shows need for Vocational Rehabilitation Yes    Vocational Rehab Packet given to patient --   will give at orientation appt            Education: Education Goals: Education classes will be provided on a variety of topics geared toward better understanding of heart health and risk factor modification. Participant will state understanding/return demonstration of topics presented as noted by education test scores.  Learning Barriers/Preferences:  Learning Barriers/Preferences - 11/27/22 0957       Learning Barriers/Preferences   Learning Barriers None    Learning Preferences None             General Cardiac Education Topics:  AED/CPR: - Group verbal and written instruction with the use of models to demonstrate the basic use of the AED with the basic ABC's of resuscitation.   Anatomy and Cardiac Procedures: - Group verbal and visual presentation and models provide information about basic cardiac anatomy and function. Reviews the testing methods done to diagnose heart disease and the outcomes of the test results. Describes the treatment choices: Medical Management, Angioplasty, or Coronary Bypass Surgery for treating various heart conditions including Myocardial Infarction, Angina, Valve Disease, and Cardiac Arrhythmias.  Written material given at graduation. Flowsheet Row Cardiac Rehab from 11/28/2022 in [facility] Cardiac and Pulmonary Rehab  Education need identified 11/28/22       Medication Safety: - Group verbal and visual instruction to review commonly prescribed medications for heart  and lung disease. Reviews the medication, class of the drug, and  side effects. Includes the steps to properly store meds and maintain the prescription regimen.  Written material given at graduation.   Intimacy: - Group verbal instruction through game format to discuss how heart and lung disease can affect sexual intimacy. Written material given at graduation..   Know Your Numbers and Heart Failure: - Group verbal and visual instruction to discuss disease risk factors for cardiac and pulmonary disease and treatment options.  Reviews associated critical values for Overweight/Obesity, Hypertension, Cholesterol, and Diabetes.  Discusses basics of heart failure: signs/symptoms and treatments.  Introduces Heart Failure Zone chart for action plan for heart failure.  Written material given at graduation.   Infection Prevention: - Provides verbal and written material to individual with discussion of infection control including proper hand washing and proper equipment cleaning during exercise session. Flowsheet Row Cardiac Rehab from 11/28/2022 in [facility] Cardiac and Pulmonary Rehab  Date 11/28/22  Educator NT  Instruction Review Code 1- Verbalizes Understanding       Falls Prevention: - Provides verbal and written material to individual with discussion of falls prevention and safety. Flowsheet Row Cardiac Rehab from 11/28/2022 in [facility] Cardiac and Pulmonary Rehab  Date 11/28/22  Educator NT  Instruction Review Code 1- Verbalizes Understanding       Other: -Provides group and verbal instruction on various topics (see comments)   Knowledge Questionnaire Score:  Knowledge Questionnaire Score - 11/27/22 1036       Knowledge Questionnaire Score   Pre Score 21/26             Core Components/Risk Factors/Patient Goals at Admission:  Personal Goals and Risk Factors at Admission - 11/27/22 0954       Core Components/Risk Factors/Patient Goals on Admission    Weight Management Yes;Weight  Loss    Intervention Weight Management: Develop a combined nutrition and exercise program designed to reach desired caloric intake, while maintaining appropriate intake of nutrient and fiber, sodium and fats, and appropriate energy expenditure required for the weight goal.;Weight Management: Provide education and appropriate resources to help participant work on and attain dietary goals.;Weight Management/Obesity: Establish reasonable short term and long term weight goals.    Expected Outcomes Short Term: Continue to assess and modify interventions until short term weight is achieved;Long Term: Adherence to nutrition and physical activity/exercise program aimed toward attainment of established weight goal;Weight Loss: Understanding of general recommendations for a balanced deficit meal plan, which promotes 1-2 lb weight loss per week and includes a negative energy balance of [phone] kcal/d;Understanding recommendations for meals to include 15-35% energy as protein, 25-35% energy from fat, 35-60% energy from carbohydrates, less than '200mg'$  of dietary cholesterol, 20-35 gm of total fiber daily;Understanding of distribution of calorie intake throughout the day with the consumption of 4-5 meals/snacks    Tobacco Cessation Yes   Quit 09/22/22   Intervention Assist the participant in steps to quit. Provide individualized education and counseling about committing to Tobacco Cessation, relapse prevention, and pharmacological support that can be provided by physician.;Advice worker, assist with locating and accessing local/national Quit Smoking programs, and support quit date choice.    Expected Outcomes Short Term: Will demonstrate readiness to quit, by selecting a quit date.;Short Term: Will quit all tobacco product use, adhering to prevention of relapse plan.;Long Term: Complete abstinence from all tobacco products for at least 12 months from quit date.    Hypertension Yes    Intervention Provide  education on lifestyle modifcations including regular physical activity/exercise, weight management, moderate sodium restriction and  increased consumption of fresh fruit, vegetables, and low fat dairy, alcohol moderation, and smoking cessation.;Monitor prescription use compliance.    Expected Outcomes Short Term: Continued assessment and intervention until BP is < 140/69m HG in hypertensive participants. < 130/840mHG in hypertensive participants with diabetes, heart failure or chronic kidney disease.;Long Term: Maintenance of blood pressure at goal levels.    Lipids Yes    Intervention Provide education and support for participant on nutrition & aerobic/resistive exercise along with prescribed medications to achieve LDL '70mg'$ , HDL >'40mg'$ .    Expected Outcomes Short Term: Participant states understanding of desired cholesterol values and is compliant with medications prescribed. Participant is following exercise prescription and nutrition guidelines.;Long Term: Cholesterol controlled with medications as prescribed, with individualized exercise RX and with personalized nutrition plan. Value goals: LDL < '70mg'$ , HDL > 40 mg.             Education:Diabetes - Individual verbal and written instruction to review signs/symptoms of diabetes, desired ranges of glucose level fasting, after meals and with exercise. Acknowledge that pre and post exercise glucose checks will be done for 3 sessions at entry of program.   Core Components/Risk Factors/Patient Goals Review:    Core Components/Risk Factors/Patient Goals at Discharge (Final Review):    ITP Comments:  ITP Comments     Row Name 11/27/22 0954 11/28/22 1234         ITP Comments Initial phone call completed. [facility] EP Orientation scheduled for Thursday 2/22 at 9:30. Completed 6MWT and gym orientation. Initial ITP created and sent for review to Dr. MaEmily FilbertMedical Director.               Comments: Initial  ITP

## 2022-12-02 ENCOUNTER — Encounter: Payer: PRIVATE HEALTH INSURANCE | Admitting: *Deleted

## 2022-12-02 DIAGNOSIS — Z955 Presence of coronary angioplasty implant and graft: Secondary | ICD-10-CM

## 2022-12-02 DIAGNOSIS — I214 Non-ST elevation (NSTEMI) myocardial infarction: Secondary | ICD-10-CM

## 2022-12-02 NOTE — Progress Notes (Signed)
Daily Session Note  Patient Details  Name: Jeanette Ball MRN: [id number] Date of Birth: Aug 31, 1966 Referring Provider:   Flowsheet Row Cardiac Rehab from 11/28/2022 in [facility] Cardiac and Pulmonary Rehab  Referring Provider Dr. Lujean Amel, MD       Encounter Date: 12/02/2022  Check In:  Session Check In - 12/02/22 0819       Check-In   Supervising physician immediately available to respond to emergencies See telemetry face sheet for immediately available ER MD    Location [facility]-Cardiac & Pulmonary Rehab    Staff Present Darlyne Russian, RN, Doyce Para, BS, ACSM CEP, Exercise Physiologist;Joseph Tessie Fass, [place]    Virtual Visit No    Medication changes reported     No    Fall or balance concerns reported    No    Warm-up and Cool-down Performed on first and last piece of equipment    Resistance Training Performed Yes    VAD Patient? No    PAD/SET Patient? No      Pain Assessment   Currently in Pain? No/denies                Social History   Tobacco Use  Smoking Status Former   Packs/day: 1.00   Years: 7.00   Total pack years: 7.00   Types: Cigarettes   Start date: 09/22/2022  Smokeless Tobacco Never    Goals Met:  Independence with exercise equipment Exercise tolerated well No report of concerns or symptoms today Strength training completed today  Goals Unmet:  Not Applicable  Comments: First full day of exercise!  Patient was oriented to gym and equipment including functions, settings, policies, and procedures.  Patient's individual exercise prescription and treatment plan were reviewed.  All starting workloads were established based on the results of the 6 minute walk test done at initial orientation visit.  The plan for exercise progression was also introduced and progression will be customized based on patient's performance and goals.    Dr. Emily Filbert is Medical Director for [place].  Dr. Ottie Glazier is  Medical Director for [facility] Pulmonary Rehabilitation.

## 2022-12-04 ENCOUNTER — Encounter: Payer: PRIVATE HEALTH INSURANCE | Admitting: *Deleted

## 2022-12-04 DIAGNOSIS — I214 Non-ST elevation (NSTEMI) myocardial infarction: Secondary | ICD-10-CM

## 2022-12-04 DIAGNOSIS — Z955 Presence of coronary angioplasty implant and graft: Secondary | ICD-10-CM

## 2022-12-04 NOTE — Progress Notes (Signed)
Daily Session Note  Patient Details  Name: Jeanette Ball MRN: [id number] Date of Birth: Dec 13, 1965 Referring Provider:   Flowsheet Row Cardiac Rehab from 11/28/2022 in [facility] Cardiac and Pulmonary Rehab  Referring Provider Dr. Lujean Amel, MD       Encounter Date: 12/04/2022  Check In:  Session Check In - 12/04/22 0759       Check-In   Supervising physician immediately available to respond to emergencies See telemetry face sheet for immediately available ER MD    Location [facility]-Cardiac & Pulmonary Rehab    Staff Present Darlyne Russian, RN, ADN;Jessica Luan [place]ing, MA, RCEP, CCRP, CCET;Joseph [place], RCP,RRT,BSRT;Noah [place], [place], Exercise Physiologist    Virtual Visit No    Medication changes reported     No    Fall or balance concerns reported    No    Warm-up and Cool-down Performed on first and last piece of equipment    Resistance Training Performed Yes    VAD Patient? No    PAD/SET Patient? No      Pain Assessment   Currently in Pain? No/denies                Social History   Tobacco Use  Smoking Status Former   Packs/day: 1.00   Years: 7.00   Total pack years: 7.00   Types: Cigarettes   Start date: 09/22/2022  Smokeless Tobacco Never    Goals Met:  Independence with exercise equipment Exercise tolerated well No report of concerns or symptoms today Strength training completed today  Goals Unmet:  Not Applicable  Comments: Pt able to follow exercise prescription today without complaint.  Will continue to monitor for progression.    Dr. Emily Filbert is Medical Director for [place].  Dr. Ottie Glazier is Medical Director for [facility] Pulmonary Rehabilitation.

## 2022-12-06 ENCOUNTER — Encounter: Payer: PRIVATE HEALTH INSURANCE | Attending: Internal Medicine | Admitting: *Deleted

## 2022-12-06 DIAGNOSIS — I252 Old myocardial infarction: Secondary | ICD-10-CM | POA: Insufficient documentation

## 2022-12-06 DIAGNOSIS — Z48812 Encounter for surgical aftercare following surgery on the circulatory system: Secondary | ICD-10-CM | POA: Insufficient documentation

## 2022-12-06 DIAGNOSIS — I214 Non-ST elevation (NSTEMI) myocardial infarction: Secondary | ICD-10-CM

## 2022-12-06 DIAGNOSIS — Z955 Presence of coronary angioplasty implant and graft: Secondary | ICD-10-CM | POA: Insufficient documentation

## 2022-12-06 NOTE — Progress Notes (Signed)
Daily Session Note  Patient Details  Name: Jeanette Ball MRN: [id number] Date of Birth: Oct 27, 1965 Referring Provider:   Flowsheet Row Cardiac Rehab from 11/28/2022 in [facility] Cardiac and Pulmonary Rehab  Referring Provider Dr. Lujean Amel, MD       Encounter Date: 12/06/2022  Check In:  Session Check In - 12/06/22 0754       Check-In   Supervising physician immediately available to respond to emergencies See telemetry face sheet for immediately available ER MD    Location [facility]Card[facility] & Pulmonary Rehab    Staff Present Darlyne Russian, RN, ADN;Jessica Luan Pulling, MA, RCEP, CCRP, CCET;Joseph [place], [place]    Virtual Visit No    Medication changes reported     No    Fall or balance concerns reported    No    Warm-up and Cool-down Performed on first and last piece of equipment    Resistance Training Performed Yes    VAD Patient? No    PAD/SET Patient? No      Pain Assessment   Currently in Pain? No/denies                Social History   Tobacco Use  Smoking Status Former   Packs/day: 1.00   Years: 7.00   Total pack years: 7.00   Types: Cigarettes   Start date: 09/22/2022  Smokeless Tobacco Never    Goals Met:  Independence with exercise equipment Exercise tolerated well No report of concerns or symptoms today Strength training completed today  Goals Unmet:  Not Applicable  Comments: Pt able to follow exercise prescription today without complaint.  Will continue to monitor for progression.    Dr. Emily Filbert is Medical Director for [place].  Dr. Ottie Glazier is Medical Director for [facility] Pulmonary Rehabilitation.

## 2022-12-09 ENCOUNTER — Encounter: Payer: PRIVATE HEALTH INSURANCE | Admitting: *Deleted

## 2022-12-09 DIAGNOSIS — Z955 Presence of coronary angioplasty implant and graft: Secondary | ICD-10-CM

## 2022-12-09 DIAGNOSIS — I214 Non-ST elevation (NSTEMI) myocardial infarction: Secondary | ICD-10-CM

## 2022-12-09 DIAGNOSIS — I252 Old myocardial infarction: Secondary | ICD-10-CM | POA: Diagnosis not present

## 2022-12-09 NOTE — Progress Notes (Signed)
Daily Session Note  Patient Details  Name: Jeanette Ball MRN: [id number] Date of Birth: [birth year] Referring Provider:   Flowsheet Row Cardiac Rehab from 11/28/2022 in [facility] Cardiac and Pulmonary Rehab  Referring Provider Dr. Lujean Amel, MD       Encounter Date: 12/09/2022  Check In:  Session Check In - 12/09/22 0754       Check-In   Supervising physician immediately available to respond to emergencies See telemetry face sheet for immediately available ER MD    Location [facility]Cardiac & Pulmonary Rehab    Staff Present Darlyne Russian, RN, Doyce Para, BS, ACSM CEP, Exercise Physiologist;Jessica Luan Pulling, MA, RCEP, CCRP, CCET;Joseph [place], [place]    Virtual Visit No    Medication changes reported     No    Fall or balance concerns reported    No    Warm-up and Cool-down Performed on first and last piece of equipment    Resistance Training Performed Yes    VAD Patient? No    PAD/SET Patient? No      Pain Assessment   Currently in Pain? No/denies                Social History   Tobacco Use  Smoking Status Former   Packs/day: 1.00   Years: 7.00   Total pack years: 7.00   Types: Cigarettes   Start date: 09/22/2022  Smokeless Tobacco Never    Goals Met:  Independence with exercise equipment Exercise tolerated well No report of concerns or symptoms today Strength training completed today  Goals Unmet:  Not Applicable  Comments: Pt able to follow exercise prescription today without complaint.  Will continue to monitor for progression.    Dr. Emily Filbert is Medical Director for [place].  Dr. Ottie Glazier is Medical Director for [facility] Pulmonary Rehabilitation.

## 2022-12-09 NOTE — Progress Notes (Signed)
Completed intitial RD consultation

## 2022-12-11 ENCOUNTER — Encounter: Payer: PRIVATE HEALTH INSURANCE | Admitting: *Deleted

## 2022-12-11 DIAGNOSIS — Z955 Presence of coronary angioplasty implant and graft: Secondary | ICD-10-CM

## 2022-12-11 DIAGNOSIS — I252 Old myocardial infarction: Secondary | ICD-10-CM | POA: Diagnosis not present

## 2022-12-11 DIAGNOSIS — I214 Non-ST elevation (NSTEMI) myocardial infarction: Secondary | ICD-10-CM

## 2022-12-11 NOTE — Progress Notes (Signed)
Daily Session Note  Patient Details  Name: Jeanette Ball MRN: [id number] Date of Birth: 05-29-1966 Referring Provider:   Flowsheet Row Cardiac Rehab from 11/28/2022 in [facility] Cardiac and Pulmonary Rehab  Referring Provider Dr. Lujean Amel, MD       Encounter Date: 12/11/2022  Check In:  Session Check In - 12/11/22 0757       Check-In   Supervising physician immediately available to respond to emergencies See telemetry face sheet for immediately available ER MD    Location [facility]-Cardiac & Pulmonary Rehab    Staff Present Darlyne Russian, RN, ADN;Jessica Luan Pulling, MA, RCEP, CCRP, CCET;Noah Tickle, BS, Exercise Physiologist    Virtual Visit No    Medication changes reported     No    Fall or balance concerns reported    No    Warm-up and Cool-down Performed on first and last piece of equipment    Resistance Training Performed Yes    VAD Patient? No    PAD/SET Patient? No      Pain Assessment   Currently in Pain? No/denies                Social History   Tobacco Use  Smoking Status Former   Packs/day: 1.00   Years: 7.00   Total pack years: 7.00   Types: Cigarettes   Start date: 09/22/2022  Smokeless Tobacco Never    Goals Met:  Independence with exercise equipment Exercise tolerated well No report of concerns or symptoms today Strength training completed today  Goals Unmet:  Not Applicable  Comments: Pt able to follow exercise prescription today without complaint.  Will continue to monitor for progression.    Dr. Emily Filbert is Medical Director for [place].  Dr. Ottie Glazier is Medical Director for [facility] Pulmonary Rehabilitation.

## 2022-12-13 ENCOUNTER — Encounter: Payer: PRIVATE HEALTH INSURANCE | Admitting: *Deleted

## 2022-12-13 DIAGNOSIS — I214 Non-ST elevation (NSTEMI) myocardial infarction: Secondary | ICD-10-CM

## 2022-12-13 DIAGNOSIS — Z955 Presence of coronary angioplasty implant and graft: Secondary | ICD-10-CM

## 2022-12-13 DIAGNOSIS — I252 Old myocardial infarction: Secondary | ICD-10-CM | POA: Diagnosis not present

## 2022-12-13 NOTE — Progress Notes (Signed)
Daily Session Note  Patient Details  Name: Jeanette Ball MRN: [id number] Date of Birth: [birth year] Referring Provider:   Flowsheet Row Cardiac Rehab from 11/28/2022 in [facility] Cardiac and Pulmonary Rehab  Referring Provider Dr. Lujean Amel, MD       Encounter Date: 12/13/2022  Check In:  Session Check In - 12/13/22 0907       Check-In   Supervising physician immediately available to respond to emergencies See telemetry face sheet for immediately available ER MD    Location [facility]-Cardiac & Pulmonary Rehab    Staff Present Heath Lark, RN, BSN, CCRP;Jessica [place], MA, RCEP, CCRP, CCET;Joseph [place], [place]    Virtual Visit No    Medication changes reported     No    Fall or balance concerns reported    No    Warm-up and Cool-down Performed on first and last piece of equipment    Resistance Training Performed Yes    VAD Patient? No    PAD/SET Patient? No      Pain Assessment   Currently in Pain? No/denies                Social History   Tobacco Use  Smoking Status Former   Packs/day: 1.00   Years: 7.00   Total pack years: 7.00   Types: Cigarettes   Start date: 09/22/2022  Smokeless Tobacco Never    Goals Met:  Independence with exercise equipment Exercise tolerated well No report of concerns or symptoms today  Goals Unmet:  Not Applicable  Comments: Pt able to follow exercise prescription today without complaint.  Will continue to monitor for progression.    Dr. Emily Filbert is Medical Director for [place].  Dr. Ottie Glazier is Medical Director for [facility] Pulmonary Rehabilitation.

## 2022-12-16 ENCOUNTER — Encounter: Payer: PRIVATE HEALTH INSURANCE | Admitting: *Deleted

## 2022-12-16 DIAGNOSIS — I252 Old myocardial infarction: Secondary | ICD-10-CM | POA: Diagnosis not present

## 2022-12-16 DIAGNOSIS — I214 Non-ST elevation (NSTEMI) myocardial infarction: Secondary | ICD-10-CM

## 2022-12-16 DIAGNOSIS — Z955 Presence of coronary angioplasty implant and graft: Secondary | ICD-10-CM

## 2022-12-16 NOTE — Progress Notes (Signed)
Daily Session Note  Patient Details  Name: Jeanette Ball MRN: [id number] Date of Birth: 1966/01/13 Referring Provider:   Flowsheet Row Cardiac Rehab from 11/28/2022 in [facility] Cardiac and Pulmonary Rehab  Referring Provider Dr. Lujean Amel, MD       Encounter Date: 12/16/2022  Check In:  Session Check In - 12/16/22 0828       Check-In   Supervising physician immediately available to respond to emergencies See telemetry face sheet for immediately available ER MD    Location [facility]-Cardiac & Pulmonary Rehab    Staff Present Darlyne Russian, RN, Doyce Para, BS, ACSM CEP, Exercise Physiologist;Joseph Tessie Fass, [place]    Virtual Visit No    Medication changes reported     No    Fall or balance concerns reported    No    Warm-up and Cool-down Performed on first and last piece of equipment    Resistance Training Performed Yes    VAD Patient? No    PAD/SET Patient? No      Pain Assessment   Currently in Pain? No/denies                Social History   Tobacco Use  Smoking Status Former   Packs/day: 1.00   Years: 7.00   Total pack years: 7.00   Types: Cigarettes   Start date: 09/22/2022  Smokeless Tobacco Never    Goals Met:  Independence with exercise equipment Exercise tolerated well No report of concerns or symptoms today Strength training completed today  Goals Unmet:  Not Applicable  Comments: Pt able to follow exercise prescription today without complaint.  Will continue to monitor for progression.    Dr. Emily Filbert is Medical Director for [place].  Dr. Ottie Glazier is Medical Director for [facility] Pulmonary Rehabilitation.

## 2022-12-18 ENCOUNTER — Encounter: Payer: PRIVATE HEALTH INSURANCE | Admitting: *Deleted

## 2022-12-18 DIAGNOSIS — Z955 Presence of coronary angioplasty implant and graft: Secondary | ICD-10-CM

## 2022-12-18 DIAGNOSIS — I214 Non-ST elevation (NSTEMI) myocardial infarction: Secondary | ICD-10-CM

## 2022-12-18 DIAGNOSIS — I252 Old myocardial infarction: Secondary | ICD-10-CM | POA: Diagnosis not present

## 2022-12-18 NOTE — Progress Notes (Signed)
Daily Session Note  Patient Details  Name: Jeanette Ball MRN: [id number] Date of Birth: Oct 27, 1965 Referring Provider:   Flowsheet Row Cardiac Rehab from 11/28/2022 in [facility] Cardiac and Pulmonary Rehab  Referring Provider Dr. Lujean Amel, MD       Encounter Date: 12/18/2022  Check In:  Session Check In - 12/18/22 0742       Check-In   Supervising physician immediately available to respond to emergencies See telemetry face sheet for immediately available ER MD    Location [facility]-Cardiac & Pulmonary Rehab    Staff Present Darlyne Russian, RN, ADN;Jessica Luan Pulling, MA, RCEP, CCRP, CCET;Joseph [place], RCP,RRT,BSRT;Noah [place], [place], Exercise Physiologist    Virtual Visit No    Medication changes reported     No    Fall or balance concerns reported    No    Warm-up and Cool-down Performed on first and last piece of equipment    Resistance Training Performed Yes    VAD Patient? No    PAD/SET Patient? No      Pain Assessment   Currently in Pain? No/denies                Social History   Tobacco Use  Smoking Status Former   Packs/day: 1.00   Years: 7.00   Total pack years: 7.00   Types: Cigarettes   Start date: 09/22/2022  Smokeless Tobacco Never    Goals Met:  Independence with exercise equipment Exercise tolerated well No report of concerns or symptoms today Strength training completed today  Goals Unmet:  Not Applicable  Comments: Pt able to follow exercise prescription today without complaint.  Will continue to monitor for progression.    Dr. Emily Filbert is Medical Director for [place].  Dr. Ottie Glazier is Medical Director for [facility] Pulmonary Rehabilitation.

## 2022-12-20 ENCOUNTER — Encounter: Payer: PRIVATE HEALTH INSURANCE | Admitting: *Deleted

## 2022-12-20 DIAGNOSIS — Z955 Presence of coronary angioplasty implant and graft: Secondary | ICD-10-CM

## 2022-12-20 DIAGNOSIS — I252 Old myocardial infarction: Secondary | ICD-10-CM | POA: Diagnosis not present

## 2022-12-20 DIAGNOSIS — I214 Non-ST elevation (NSTEMI) myocardial infarction: Secondary | ICD-10-CM

## 2022-12-20 NOTE — Progress Notes (Signed)
Daily Session Note  Patient Details  Name: Jeanette Ball MRN: [id number] Date of Birth: 05-Feb-1966 Referring Provider:   Flowsheet Row Cardiac Rehab from 11/28/2022 in [facility] Cardiac and Pulmonary Rehab  Referring Provider Dr. Lujean Amel, MD       Encounter Date: 12/20/2022  Check In:  Session Check In - 12/20/22 0839       Check-In   Supervising physician immediately available to respond to emergencies See telemetry face sheet for immediately available ER MD    Location [facility]-Cardiac & Pulmonary Rehab    Staff Present Heath Lark, RN, BSN, CCRP;Joseph [place], RCP,RRT,BSRT;Jessica [place], [place], [place], CCRP, CCET    Virtual Visit No    Medication changes reported     No    Fall or balance concerns reported    No    Warm-up and Cool-down Performed on first and last piece of equipment    Resistance Training Performed Yes    VAD Patient? No    PAD/SET Patient? No      Pain Assessment   Currently in Pain? No/denies                Social History   Tobacco Use  Smoking Status Former   Packs/day: 1.00   Years: 7.00   Additional pack years: 0.00   Total pack years: 7.00   Types: Cigarettes   Start date: 09/22/2022  Smokeless Tobacco Never    Goals Met:  Independence with exercise equipment Exercise tolerated well No report of concerns or symptoms today  Goals Unmet:  Not Applicable  Comments: Pt able to follow exercise prescription today without complaint.  Will continue to monitor for progression.    Dr. Emily Filbert is Medical Director for [place].  Dr. Ottie Glazier is Medical Director for [facility] Pulmonary Rehabilitation.

## 2022-12-23 ENCOUNTER — Encounter: Payer: PRIVATE HEALTH INSURANCE | Admitting: *Deleted

## 2022-12-23 DIAGNOSIS — I214 Non-ST elevation (NSTEMI) myocardial infarction: Secondary | ICD-10-CM

## 2022-12-23 DIAGNOSIS — Z955 Presence of coronary angioplasty implant and graft: Secondary | ICD-10-CM

## 2022-12-23 DIAGNOSIS — I252 Old myocardial infarction: Secondary | ICD-10-CM | POA: Diagnosis not present

## 2022-12-23 NOTE — Progress Notes (Signed)
Daily Session Note  Patient Details  Name: Jeanette Ball MRN: [id number] Date of Birth: 1966-01-01 Referring Provider:   Flowsheet Row Cardiac Rehab from 11/28/2022 in [facility] Cardiac and Pulmonary Rehab  Referring Provider Dr. Lujean Amel, MD       Encounter Date: 12/23/2022  Check In:  Session Check In - 12/23/22 0807       Check-In   Supervising physician immediately available to respond to emergencies See telemetry face sheet for immediately available ER MD    Location [facility]-Cardiac & Pulmonary Rehab    Staff Present Darlyne Russian, RN, Doyce Para, BS, ACSM CEP, Exercise Physiologist;Joseph Tessie Fass, [place]    Virtual Visit No    Medication changes reported     No    Fall or balance concerns reported    No    Warm-up and Cool-down Performed on first and last piece of equipment    Resistance Training Performed Yes    VAD Patient? No    PAD/SET Patient? No      Pain Assessment   Currently in Pain? No/denies                Social History   Tobacco Use  Smoking Status Former   Packs/day: 1.00   Years: 7.00   Additional pack years: 0.00   Total pack years: 7.00   Types: Cigarettes   Start date: 09/22/2022  Smokeless Tobacco Never    Goals Met:  Independence with exercise equipment Exercise tolerated well No report of concerns or symptoms today Strength training completed today  Goals Unmet:  Not Applicable  Comments: Pt able to follow exercise prescription today without complaint.  Will continue to monitor for progression.    Dr. Emily Filbert is Medical Director for [place].  Dr. Ottie Glazier is Medical Director for [facility] Pulmonary Rehabilitation.

## 2022-12-25 ENCOUNTER — Encounter: Payer: Self-pay | Admitting: *Deleted

## 2022-12-25 ENCOUNTER — Encounter: Payer: PRIVATE HEALTH INSURANCE | Admitting: *Deleted

## 2022-12-25 DIAGNOSIS — Z955 Presence of coronary angioplasty implant and graft: Secondary | ICD-10-CM

## 2022-12-25 DIAGNOSIS — I214 Non-ST elevation (NSTEMI) myocardial infarction: Secondary | ICD-10-CM

## 2022-12-25 DIAGNOSIS — I252 Old myocardial infarction: Secondary | ICD-10-CM | POA: Diagnosis not present

## 2022-12-25 NOTE — Progress Notes (Signed)
Daily Session Note  Patient Details  Name: Jeanette Ball MRN: [id number] Date of Birth: [birth year] Referring Provider:   Flowsheet Row Cardiac Rehab from 11/28/2022 in [facility] Cardiac and Pulmonary Rehab  Referring Provider Dr. Lujean Amel, MD       Encounter Date: 12/25/2022  Check In:  Session Check In - 12/25/22 0756       Check-In   Supervising physician immediately available to respond to emergencies See telemetry face sheet for immediately available ER MD    Location [facility]-Cardiac & Pulmonary Rehab    Staff Present Darlyne Russian, RN, ADN;Joseph Tessie Fass, RCP,RRT,BSRT;Noah Tickle, BS, Exercise Physiologist    Virtual Visit No    Medication changes reported     No    Fall or balance concerns reported    No    Warm-up and Cool-down Performed on first and last piece of equipment    Resistance Training Performed Yes    VAD Patient? No    PAD/SET Patient? No      Pain Assessment   Currently in Pain? No/denies                Social History   Tobacco Use  Smoking Status Former   Packs/day: 1.00   Years: 7.00   Additional pack years: 0.00   Total pack years: 7.00   Types: Cigarettes   Start date: 09/22/2022  Smokeless Tobacco Never    Goals Met:  Independence with exercise equipment Exercise tolerated well No report of concerns or symptoms today Strength training completed today  Goals Unmet:  Not Applicable  Comments: Pt able to follow exercise prescription today without complaint.  Will continue to monitor for progression.    Dr. Emily Filbert is Medical Director for [place].  Dr. Ottie Glazier is Medical Director for [facility] Pulmonary Rehabilitation.

## 2022-12-25 NOTE — Progress Notes (Signed)
Cardiac Individual Treatment Plan  Patient Details  Name: SHANTRELLE DELUKE MRN: [id number] Date of Birth: 02/23/1966 Referring Provider:   Flowsheet Row Cardiac Rehab from 11/28/2022 in [facility] Cardiac and Pulmonary Rehab  Referring Provider Dr. Lujean Amel, MD       Initial Encounter Date:  Flowsheet Row Cardiac Rehab from 11/28/2022 in [facility] Cardiac and Pulmonary Rehab  Date 11/28/22       Visit Diagnosis: NSTEMI (non-ST elevated myocardial infarction) [facility])  Status post coronary artery stent placement  Patient's Home Medications on Admission:  Current Outpatient Medications:    ALPRAZolam (XANAX) 0.5 MG tablet, Take by mouth., Disp: , Rfl:    aspirin 81 MG chewable tablet, Chew 1 tablet (81 mg total) by mouth daily. (Patient not taking: Reported on 11/27/2022), Disp: 30 tablet, Rfl: 0   clopidogrel (PLAVIX) 75 MG tablet, Take 1 tablet (75 mg total) by mouth daily., Disp: 30 tablet, Rfl: 0   losartan (COZAAR) 50 MG tablet, Take 1 tablet (50 mg total) by mouth daily., Disp: 30 tablet, Rfl: 0   metoprolol tartrate (LOPRESSOR) 25 MG tablet, Take 1 tablet (25 mg total) by mouth 2 (two) times daily. (Patient not taking: Reported on 11/27/2022), Disp: 60 tablet, Rfl: 0   nicotine (NICODERM CQ - DOSED IN MG/24 HOURS) 21 mg/24hr patch, Place 1 patch (21 mg total) onto the skin daily as needed (nicotine craving). (Patient not taking: Reported on 11/27/2022), Disp: 28 patch, Rfl: 0   rosuvastatin (CRESTOR) 40 MG tablet, Take 1 tablet (40 mg total) by mouth daily., Disp: 30 tablet, Rfl: 0  Past Medical History: Past Medical History:  Diagnosis Date   Alcohol use    Tobacco use     Tobacco Use: Social History   Tobacco Use  Smoking Status Former   Packs/day: 1.00   Years: 7.00   Additional pack years: 0.00   Total pack years: 7.00   Types: Cigarettes   Start date: 09/22/2022  Smokeless Tobacco Never    Labs: Review Flowsheet       Latest Ref Rng & Units 09/23/2022  Labs  for ITP Cardiac and Pulmonary Rehab  Cholestrol 0 - 200 mg/dL 220   LDL (calc) 0 - 99 mg/dL 146   HDL-C >40 mg/dL 50   Trlycerides <150 mg/dL 118      Exercise Target Goals: Exercise Program Goal: Individual exercise prescription set using results from initial 6 min walk test and THRR while considering  patient's activity barriers and safety.   Exercise Prescription Goal: Initial exercise prescription builds to 30-45 minutes a day of aerobic activity, 2-3 days per week.  Home exercise guidelines will be given to patient during program as part of exercise prescription that the participant will acknowledge.   Education: Aerobic Exercise: - Group verbal and visual presentation on the components of exercise prescription. Introduces F.I.T.T principle from ACSM for exercise prescriptions.  Reviews F.I.T.T. principles of aerobic exercise including progression. Written material given at graduation.   Education: Resistance Exercise: - Group verbal and visual presentation on the components of exercise prescription. Introduces F.I.T.T principle from ACSM for exercise prescriptions  Reviews F.I.T.T. principles of resistance exercise including progression. Written material given at graduation.    Education: Exercise & Equipment Safety: - Individual verbal instruction and demonstration of equipment use and safety with use of the equipment. Flowsheet Row Cardiac Rehab from 12/18/2022 in [facility] Cardiac and Pulmonary Rehab  Date 11/28/22  Educator NT  Instruction Review Code 1- Verbalizes Understanding  Education: Exercise Physiology & General Exercise Guidelines: - Group verbal and written instruction with models to review the exercise physiology of the cardiovascular system and associated critical values. Provides general exercise guidelines with specific guidelines to those with heart or lung disease.    Education: Flexibility, Balance, Mind/Body Relaxation: - Group verbal and visual  presentation with interactive activity on the components of exercise prescription. Introduces F.I.T.T principle from ACSM for exercise prescriptions. Reviews F.I.T.T. principles of flexibility and balance exercise training including progression. Also discusses the mind body connection.  Reviews various relaxation techniques to help reduce and manage stress (i.e. Deep breathing, progressive muscle relaxation, and visualization). Balance handout provided to take home. Written material given at graduation.   Activity Barriers & Risk Stratification:  Activity Barriers & Cardiac Risk Stratification - 11/28/22 1304       Activity Barriers & Cardiac Risk Stratification   Activity Barriers None    Cardiac Risk Stratification Moderate             6 Minute Walk:  6 Minute Walk     Row Name 11/28/22 1253         6 Minute Walk   Phase Initial     Distance 1415 feet     Walk Time 6 minutes     # of Rest Breaks 0     MPH 2.68     METS 3.97     RPE 9     Perceived Dyspnea  0     VO2 Peak 13.88     Symptoms No     Resting HR 68 bpm     Resting BP 126/68     Resting Oxygen Saturation  99 %     Exercise Oxygen Saturation  during 6 min walk 98 %     Max Ex. HR 94 bpm     Max Ex. BP 144/82     2 Minute Post BP 124/72              Oxygen Initial Assessment:   Oxygen Re-Evaluation:   Oxygen Discharge (Final Oxygen Re-Evaluation):   Initial Exercise Prescription:  Initial Exercise Prescription - 11/28/22 1300       Date of Initial Exercise RX and Referring Provider   Date 11/28/22    Referring Provider Dr. Lujean Amel, MD      Oxygen   Maintain Oxygen Saturation 88% or higher      Treadmill   MPH 2.4    Grade 1    Minutes 15    METs 3.17      Recumbant Bike   Level 3    RPM 50    Watts 54    Minutes 15    METs 3.97      Recumbant Elliptical   Level 1.5    RPM 50    Minutes 15    METs 3.97      REL-XR   Level 3    Speed 50    Minutes 15    METs  3.97      Prescription Details   Frequency (times per week) 3    Duration Progress to 30 minutes of continuous aerobic without signs/symptoms of physical distress      Intensity   THRR 40-80% of Max Heartrate 107-146    Ratings of Perceived Exertion 11-13    Perceived Dyspnea 0-4      Progression   Progression Continue to progress workloads to maintain intensity without signs/symptoms of physical distress.  Resistance Training   Training Prescription Yes    Weight 3 lb    Reps 10-15             Perform Capillary Blood Glucose checks as needed.  Exercise Prescription Changes:   Exercise Prescription Changes     Row Name 11/28/22 1300 12/10/22 1600           Response to Exercise   Blood Pressure (Admit) 126/68 104/62      Blood Pressure (Exercise) 144/82 118/60      Blood Pressure (Exit) 124/72 110/62      Heart Rate (Admit) 68 bpm 84 bpm      Heart Rate (Exercise) 94 bpm 140 bpm      Heart Rate (Exit) 69 bpm 99 bpm      Oxygen Saturation (Admit) 99 % --      Oxygen Saturation (Exercise) 98 % --      Rating of Perceived Exertion (Exercise) 9 12      Perceived Dyspnea (Exercise) 0 --      Symptoms None none      Comments 6MWT Results 2nd full week of exercise      Duration -- Continue with 30 min of aerobic exercise without signs/symptoms of physical distress.      Intensity -- THRR unchanged        Progression   Progression -- Continue to progress workloads to maintain intensity without signs/symptoms of physical distress.      Average METs -- 3.49        Resistance Training   Training Prescription -- Yes      Weight -- 3 lb      Reps -- 10-15        Interval Training   Interval Training -- No        Treadmill   MPH -- 2.6      Grade -- 2.5      Minutes -- 15      METs -- 3.89        Recumbant Bike   Level -- 3      Watts -- 27      Minutes -- 15      METs -- 2.97        Recumbant Elliptical   Level -- 3      Minutes -- 15      METs --  2.5        REL-XR   Level -- 4      Minutes -- 15      METs -- 5        Oxygen   Maintain Oxygen Saturation -- 88% or higher               Exercise Comments:   Exercise Comments     Row Name 12/02/22 0820           Exercise Comments First full day of exercise!  Patient was oriented to gym and equipment including functions, settings, policies, and procedures.  Patient's individual exercise prescription and treatment plan were reviewed.  All starting workloads were established based on the results of the 6 minute walk test done at initial orientation visit.  The plan for exercise progression was also introduced and progression will be customized based on patient's performance and goals.                Exercise Goals and Review:   Exercise Goals     Row Name 11/28/22 1239  Exercise Goals   Increase Physical Activity Yes       Intervention Provide advice, education, support and counseling about physical activity/exercise needs.;Develop an individualized exercise prescription for aerobic and resistive training based on initial evaluation findings, risk stratification, comorbidities and participant's personal goals.       Expected Outcomes Short Term: Attend rehab on a regular basis to increase amount of physical activity.;Long Term: Exercising regularly at least 3-5 days a week.;Long Term: Add in home exercise to make exercise part of routine and to increase amount of physical activity.       Increase Strength and Stamina Yes       Intervention Develop an individualized exercise prescription for aerobic and resistive training based on initial evaluation findings, risk stratification, comorbidities and participant's personal goals.;Provide advice, education, support and counseling about physical activity/exercise needs.       Expected Outcomes Short Term: Increase workloads from initial exercise prescription for resistance, speed, and METs.;Short Term: Perform  resistance training exercises routinely during rehab and add in resistance training at home;Long Term: Improve cardiorespiratory fitness, muscular endurance and strength as measured by increased METs and functional capacity (6MWT)       Able to understand and use rate of perceived exertion (RPE) scale Yes       Intervention Provide education and explanation on how to use RPE scale       Expected Outcomes Short Term: Able to use RPE daily in rehab to express subjective intensity level;Long Term:  Able to use RPE to guide intensity level when exercising independently       Able to understand and use Dyspnea scale Yes       Intervention Provide education and explanation on how to use Dyspnea scale       Expected Outcomes Short Term: Able to use Dyspnea scale daily in rehab to express subjective sense of shortness of breath during exertion;Long Term: Able to use Dyspnea scale to guide intensity level when exercising independently       Knowledge and understanding of Target Heart Rate Range (THRR) Yes       Intervention Provide education and explanation of THRR including how the numbers were predicted and where they are located for reference       Expected Outcomes Short Term: Able to state/look up THRR;Short Term: Able to use daily as guideline for intensity in rehab;Long Term: Able to use THRR to govern intensity when exercising independently       Able to check pulse independently Yes       Intervention Provide education and demonstration on how to check pulse in carotid and radial arteries.;Review the importance of being able to check your own pulse for safety during independent exercise       Expected Outcomes Short Term: Able to explain why pulse checking is important during independent exercise;Long Term: Able to check pulse independently and accurately       Understanding of Exercise Prescription Yes       Intervention Provide education, explanation, and written materials on patient's individual  exercise prescription       Expected Outcomes Short Term: Able to explain program exercise prescription;Long Term: Able to explain home exercise prescription to exercise independently                Exercise Goals Re-Evaluation :  Exercise Goals Re-Evaluation     Row Name 12/02/22 0820 12/09/22 1518 12/10/22 1603         Exercise Goal Re-Evaluation  Exercise Goals Review Able to understand and use rate of perceived exertion (RPE) scale;Able to understand and use Dyspnea scale;Knowledge and understanding of Target Heart Rate Range (THRR);Understanding of Exercise Prescription Able to understand and use rate of perceived exertion (RPE) scale;Able to understand and use Dyspnea scale;Knowledge and understanding of Target Heart Rate Range (THRR);Understanding of Exercise Prescription Increase Physical Activity;Increase Strength and Stamina;Understanding of Exercise Prescription     Comments Reviewed RPE scale, THR and program prescription with pt today.  Pt voiced understanding and was given a copy of goals to take home. Gradie reports being active at home caring for her grandchild and mother, but is not as active as she was pre-COVID. She reports that she would like to start exercising outside of rehab, reviewed general recommendations for aerobic exercise 150 minutes/week (~30 minutes for 5 days/week) - she would like to start walking outside which she feels would help her physical and mental health. She has not reviewed home exercise with EP yet as she began rehab last week. Mairead is doing well in rehab for her first couple of weeks she has been here. She has followed her initial exercise prescription well and was even able to increase to level 4 on the XR and up to a 2% incline on the treadmill. She has been reaching her THR and reporting appropriate RPEs. We will continue to monitor as she progresses in the program.     Expected Outcomes Short: Use RPE daily to regulate intensity. Long: Follow  program prescription in THR. ST: walk outside tues/Thurs when not at rehab, EP to go over home exercise. LT: Complete aerobic exercise 150 minutes/week (~30 minutes for 5 days/week) Short: Continue to exercise at initial exercise prescription Long: Increase overall MET level and stamina              Discharge Exercise Prescription (Final Exercise Prescription Changes):  Exercise Prescription Changes - 12/10/22 1600       Response to Exercise   Blood Pressure (Admit) 104/62    Blood Pressure (Exercise) 118/60    Blood Pressure (Exit) 110/62    Heart Rate (Admit) 84 bpm    Heart Rate (Exercise) 140 bpm    Heart Rate (Exit) 99 bpm    Rating of Perceived Exertion (Exercise) 12    Symptoms none    Comments 2nd full week of exercise    Duration Continue with 30 min of aerobic exercise without signs/symptoms of physical distress.    Intensity THRR unchanged      Progression   Progression Continue to progress workloads to maintain intensity without signs/symptoms of physical distress.    Average METs 3.49      Resistance Training   Training Prescription Yes    Weight 3 lb    Reps 10-15      Interval Training   Interval Training No      Treadmill   MPH 2.6    Grade 2.5    Minutes 15    METs 3.89      Recumbant Bike   Level 3    Watts 27    Minutes 15    METs 2.97      Recumbant Elliptical   Level 3    Minutes 15    METs 2.5      REL-XR   Level 4    Minutes 15    METs 5      Oxygen   Maintain Oxygen Saturation 88% or higher  Nutrition:  Target Goals: Understanding of nutrition guidelines, daily intake of sodium 1500mg , cholesterol 200mg , calories 30% from fat and 7% or less from saturated fats, daily to have 5 or more servings of fruits and vegetables.  Education: All About Nutrition: -Group instruction provided by verbal, written material, interactive activities, discussions, models, and posters to present general guidelines for heart  healthy nutrition including fat, fiber, MyPlate, the role of sodium in heart healthy nutrition, utilization of the nutrition label, and utilization of this knowledge for meal planning. Follow up email sent as well. Written material given at graduation. Flowsheet Row Cardiac Rehab from 12/18/2022 in [facility] Cardiac and Pulmonary Rehab  Education need identified 11/28/22       Biometrics:  Pre Biometrics - 11/28/22 1304       Pre Biometrics   Height 5' 8.5" (1.74 m)    Weight 190 lb 8 oz (86.4 kg)    Waist Circumference 37 inches    Hip Circumference 42 inches    Waist to Hip Ratio 0.88 %    BMI (Calculated) 28.54    Single Leg Stand 16.7 seconds              Nutrition Therapy Plan and Nutrition Goals:  Nutrition Therapy & Goals - 12/09/22 1333       Nutrition Therapy   Diet Heart healthy, low Na    Drug/Food Interactions Statins/Certain Fruits    Protein (specify units) 95g    Fiber 30 grams    Whole Grain Foods 3 servings    Saturated Fats 16 max. grams    Fruits and Vegetables 8 servings/day    Sodium 2 grams      Personal Nutrition Goals   Nutrition Goal ST: make wrap/sandwich at home with whole wheat bread/wraps for lunch, add black beans to rice, add protein, fiber, and fat to snacks such as peanut butter to rice cakes with fruit, swap butter for avocado oil. LT: follow MyPlate guidelines, limit eating out <1-2x/week, include at least 30g of fiber/day, limit saturated fat <16g/day    Comments Initial Assessment: 57 y.o. F admitted to cardiac rehab s/p NSTEMI with stent. PMHx includes CAD, HLD, HTN, anxiety, tobacco use. Reviewed relevant medications: alprazolam (xanax), citalopram (celexa), rosuvastatin (crestor). Reviewed most recent labwork. Anthropometrics: Ht: 5'8.5" Wt: 86.4 kg BMI: 28.54. PYP Score: 34. Lifestyle: Charlotta reports that she used to be more active before COVID, but still stays busy caring for her grandchild and mother. She would like to begin structured  exercise outside of rehab as well. Food recall:Meal 1: nabs or rice cakes when going to rehab. Boiled egg and scrambled egg at home, but will have egg and cheese out with no bread. Meal 2: Goes out to get a sub that she usually splits to have for dinner as well (tomato, [place], cheese, cucumber, banana pepper, pickles, onions, and mayo on flatbread) Meal 3: baked chicken, squash, broccoli, cauliflower Snack 1-3: she will have snacks with her grandson such as fruit snacks or sugar-free popciles, but for herself she will buy peanuts, trailmix. Drinks: red bull every once in a while, water and sometimes diet soda - she has cut down on redbull and soda; she reports thats she used to drink it often during the day. Common foods/practices: Added fat: she adds butter to her food most of the time which she knows she should limit. She does not like the flavor of olive oil when she tried it previously. Added salt: she does not salt her food  aside from some seasoning blends. Eating out: most days. Red meat: <1-2x/week. Comments: Sharanda reports she used to always bake her foods instead of frying and eating vegetables, but after getting home from the hospital she had changes in taste that has been improving - she still eats baked chicken and rice, but eats out often and feels that she has been eating more snack items. Neesa would like to cut down on butter and cook more at home. Education: Provided Heart healthy education; reviewing MyPlate, types of fat, sodium, added sugar, nutrition label. Provided meal planning/shopping education; reviewing how to build flavor and easy meals/swaps. Suggested she add protein, fiber, and some fat to most meals and snacks; examples includes peanut butter with fruit to ricecake, add black beans to rice, swap butter for avocado oil (in moderation - can use reusable spray bottle), and make whole wheat wraps or sandwiches at home for lunch at least 3-4x/week with lots of cooked and/or raw vegetables  and lean protein such as leftover baked chicken.      Intervention Plan   Intervention Prescribe, educate and counsel regarding individualized specific dietary modifications aiming towards targeted core components such as weight, hypertension, lipid management, diabetes, heart failure and other comorbidities.;Nutrition handout(s) given to patient.    Expected Outcomes Short Term Goal: Understand basic principles of dietary content, such as calories, fat, sodium, cholesterol and nutrients.;Short Term Goal: A plan has been developed with personal nutrition goals set during dietitian appointment.;Long Term Goal: Adherence to prescribed nutrition plan.             Nutrition Assessments:  MEDIFICTS Score Key: ?70 Need to make dietary changes  40-70 Heart Healthy Diet ? 40 Therapeutic Level Cholesterol Diet  Flowsheet Row Cardiac Rehab from 11/27/2022 in [facility] Cardiac and Pulmonary Rehab  Picture Your Plate Total Score on Admission 34      Picture Your Plate Scores: [id number] Unhealthy dietary pattern with much room for improvement. 41-50 Dietary pattern unlikely to meet recommendations for good health and room for improvement. 51-60 More healthful dietary pattern, with some room for improvement.  >60 Healthy dietary pattern, although there may be some specific behaviors that could be improved.    Nutrition Goals Re-Evaluation:   Nutrition Goals Discharge (Final Nutrition Goals Re-Evaluation):   Psychosocial: Target Goals: Acknowledge presence or absence of significant depression and/or stress, maximize coping skills, provide positive support system. Participant is able to verbalize types and ability to use techniques and skills needed for reducing stress and depression.   Education: Stress, Anxiety, and Depression - Group verbal and visual presentation to define topics covered.  Reviews how body is impacted by stress, anxiety, and depression.  Also discusses healthy ways to reduce stress  and to treat/manage anxiety and depression.  Written material given at graduation. Flowsheet Row Cardiac Rehab from 12/18/2022 in [facility] Cardiac and Pulmonary Rehab  Date 12/18/22  Educator [facility]  Instruction Review Code 1- Verbalizes Understanding       Education: Sleep Hygiene -Provides group verbal and written instruction about how sleep can affect your health.  Define sleep hygiene, discuss sleep cycles and impact of sleep habits. Review good sleep hygiene tips.    Initial Review & Psychosocial Screening:  Initial Psych Review & Screening - 11/27/22 1002       Initial Review   Current issues with Current Anxiety/Panic;Current Depression;Current Stress Concerns    Comments Health concerns, replaying MI, noticing increased anxiety and not feeling like it can be controlled at times  Family Dynamics   Good Support System? Yes      Barriers   Psychosocial barriers to participate in program The patient should benefit from training in stress management and relaxation.      Screening Interventions   Interventions Encouraged to exercise;Provide feedback about the scores to participant;To provide support and resources with identified psychosocial needs    Expected Outcomes Short Term goal: Utilizing psychosocial counselor, staff and physician to assist with identification of specific Stressors or current issues interfering with healing process. Setting desired goal for each stressor or current issue identified.;Long Term Goal: Stressors or current issues are controlled or eliminated.;Short Term goal: Identification and review with participant of any Quality of Life or Depression concerns found by scoring the questionnaire.;Long Term goal: The participant improves quality of Life and PHQ9 Scores as seen by post scores and/or verbalization of changes             Quality of Life Scores:   Quality of Life - 11/27/22 1037       Quality of Life   Select Quality of Life      Quality of  Life Scores   Health/Function Pre 9.4 %    Socioeconomic Pre 13.13 %    Psych/Spiritual Pre 22.29 %    Family Pre 19.8 %    GLOBAL Pre 14.31 %            Scores of 19 and below usually indicate a poorer quality of life in these areas.  A difference of  2-3 points is a clinically meaningful difference.  A difference of 2-3 points in the total score of the Quality of Life Index has been associated with significant improvement in overall quality of life, self-image, physical symptoms, and general health in studies assessing change in quality of life.  PHQ-9: Review Flowsheet       11/28/2022  Depression screen PHQ 2/9  Decreased Interest 3  Down, Depressed, Hopeless 3  PHQ - 2 Score 6  Altered sleeping 2  Tired, decreased energy 3  Change in appetite 3  Feeling bad or failure about yourself  3  Trouble concentrating 3  Moving slowly or fidgety/restless 3  Suicidal thoughts 0  PHQ-9 Score 23  Difficult doing work/chores Extremely dIfficult   Interpretation of Total Score  Total Score Depression Severity:  1-4 = Minimal depression, 5-9 = Mild depression, 10-14 = Moderate depression, 15-19 = Moderately severe depression, 20-27 = Severe depression   Psychosocial Evaluation and Intervention:  Psychosocial Evaluation - 11/27/22 1021       Psychosocial Evaluation & Interventions   Interventions Encouraged to exercise with the program and follow exercise prescription;Stress management education;Relaxation education    Comments 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 She reports the mental load has been more than she thought it would be. When its quiet, she finds herself replaying the her MI events and the "what ifs." She retired from [employer] in 2021 to help care for her grandson. She did go back part time with [employer] but does not know if she wants to return because the mental stress of it all. She is interested in [employer] and hopes maybe  she can find something part time from home. She quit smoking the day of her heart attack because she is scared to start back. She wants to focus on her mental health and is very ready to start the program to work on caring for herself.  Expected Outcomes Short: attend cardiac rehab for education and exercise. Long: develop and maintain positive self care habits.    Continue Psychosocial Services  Follow up required by staff             Psychosocial Re-Evaluation:  Psychosocial Re-Evaluation     [facility] Name 12/11/22 0815             Psychosocial Re-Evaluation   Current issues with Current Stress Concerns;Current Anxiety/Panic       Comments Earlee states that she has had a lot of stress related to family concerns. She has been taking care of her mother who has her own health needs. She is also stressed about not being able to work currently. She has also been overwhelmed with taking medications and her different appointments. She states that she does not have a healthy way to relieve stress but is looking for new ways to relax. She does enjoy putting her headphones in and listening to music and she also states that exercise has been a good stress reliever.       Expected Outcomes Short: Continue to look for healthy avenues of stress relief. Long: Continue to attend rehab for stress relief.       Interventions Encouraged to attend Cardiac Rehabilitation for the exercise       Continue Psychosocial Services  Follow up required by staff         Initial Review   Source of Stress Concerns Family;Chronic Illness       Comments Health concerns, replaying MI, noticing increased anxiety and not feeling like it can be controlled at times                Psychosocial Discharge (Final Psychosocial Re-Evaluation):  Psychosocial Re-Evaluation - 12/11/22 0815       Psychosocial Re-Evaluation   Current issues with Current Stress Concerns;Current Anxiety/Panic    Comments Vannesa states that she  has had a lot of stress related to family concerns. She has been taking care of her mother who has her own health needs. She is also stressed about not being able to work currently. She has also been overwhelmed with taking medications and her different appointments. She states that she does not have a healthy way to relieve stress but is looking for new ways to relax. She does enjoy putting her headphones in and listening to music and she also states that exercise has been a good stress reliever.    Expected Outcomes Short: Continue to look for healthy avenues of stress relief. Long: Continue to attend rehab for stress relief.    Interventions Encouraged to attend Cardiac Rehabilitation for the exercise    Continue Psychosocial Services  Follow up required by staff      Initial Review   Source of Stress Concerns Family;Chronic Illness    Comments Health concerns, replaying MI, noticing increased anxiety and not feeling like it can be controlled at times             Vocational Rehabilitation: Provide vocational rehab assistance to qualifying candidates.   Vocational Rehab Evaluation & Intervention:  Vocational Rehab - 11/28/22 1407       Initial Vocational Rehab Evaluation & Intervention   Assessment shows need for Vocational Rehabilitation Yes    Vocational Rehab Packet given to patient 11/28/22    Documents faxed to [facility] Dept of Vocational Rehabilitation 11/28/22             Education: Education Goals: Education classes will be  provided on a variety of topics geared toward better understanding of heart health and risk factor modification. Participant will state understanding/return demonstration of topics presented as noted by education test scores.  Learning Barriers/Preferences:  Learning Barriers/Preferences - 11/27/22 0957       Learning Barriers/Preferences   Learning Barriers None    Learning Preferences None             General Cardiac Education  Topics:  AED/CPR: - Group verbal and written instruction with the use of models to demonstrate the basic use of the AED with the basic ABC's of resuscitation.   Anatomy and Cardiac Procedures: - Group verbal and visual presentation and models provide information about basic cardiac anatomy and function. Reviews the testing methods done to diagnose heart disease and the outcomes of the test results. Describes the treatment choices: Medical Management, Angioplasty, or Coronary Bypass Surgery for treating various heart conditions including Myocardial Infarction, Angina, Valve Disease, and Cardiac Arrhythmias.  Written material given at graduation. Flowsheet Row Cardiac Rehab from 12/18/2022 in [facility] Cardiac and [facility]hab  Education need identified 11/28/22       Medication Safety: - Group verbal and visual instruction to review commonly prescribed medications for heart and lung disease. Reviews the medication, class of the drug, and side effects. Includes the steps to properly store meds and maintain the prescription regimen.  Written material given at graduation.   Intimacy: - Group verbal instruction through game format to discuss how heart and lung disease can affect sexual intimacy. Written material given at graduation..   Know Your Numbers and Heart Failure: - Group verbal and visual instruction to discuss disease risk factors for cardiac and pulmonary disease and treatment options.  Reviews associated critical values for Overweight/Obesity, Hypertension, Cholesterol, and Diabetes.  Discusses basics of heart failure: signs/symptoms and treatments.  Introduces Heart Failure Zone chart for action plan for heart failure.  Written material given at graduation. Flowsheet Row Cardiac Rehab from 12/18/2022 in [facility] Cardiac and Pulmonary Rehab  Date 12/04/22  Educator [facility]  Instruction Review Code 1- Verbalizes Understanding       Infection Prevention: - Provides verbal and written  material to individual with discussion of infection control including proper hand washing and proper equipment cleaning during exercise session. Flowsheet Row Cardiac Rehab from 12/18/2022 in [facility] Cardiac and Pulmonary Rehab  Date 11/28/22  Educator NT  Instruction Review Code 1- Verbalizes Understanding       Falls Prevention: - Provides verbal and written material to individual with discussion of falls prevention and safety. Flowsheet Row Cardiac Rehab from 12/18/2022 in [facility] Cardiac and Pulmonary Rehab  Date 11/28/22  Educator NT  Instruction Review Code 1- Verbalizes Understanding       Other: -Provides group and verbal instruction on various topics (see comments)   Knowledge Questionnaire Score:  Knowledge Questionnaire Score - 11/27/22 1036       Knowledge Questionnaire Score   Pre Score 21/26             Core Components/Risk Factors/Patient Goals at Admission:  Personal Goals and Risk Factors at Admission - 11/27/22 0954       Core Components/Risk Factors/Patient Goals on Admission    Weight Management Yes;Weight Loss    Intervention Weight Management: Develop a combined nutrition and exercise program designed to reach desired caloric intake, while maintaining appropriate intake of nutrient and fiber, sodium and fats, and appropriate energy expenditure required for the weight goal.;Weight Management: Provide education and appropriate resources to  help participant work on and attain dietary goals.;Weight Management/Obesity: Establish reasonable short term and long term weight goals.    Expected Outcomes Short Term: Continue to assess and modify interventions until short term weight is achieved;Long Term: Adherence to nutrition and physical activity/exercise program aimed toward attainment of established weight goal;Weight Loss: Understanding of general recommendations for a balanced deficit meal plan, which promotes 1-2 lb weight loss per week and includes a negative  energy balance of [phone] kcal/d;Understanding recommendations for meals to include 15-35% energy as protein, 25-35% energy from fat, 35-60% energy from carbohydrates, less than 200mg  of dietary cholesterol, 20-35 gm of total fiber daily;Understanding of distribution of calorie intake throughout the day with the consumption of 4-5 meals/snacks    Tobacco Cessation Yes   Quit 09/22/22   Intervention Assist the participant in steps to quit. Provide individualized education and counseling about committing to Tobacco Cessation, relapse prevention, and pharmacological support that can be provided by physician.;Advice worker, assist with locating and accessing local/national Quit Smoking programs, and support quit date choice.    Expected Outcomes Short Term: Will demonstrate readiness to quit, by selecting a quit date.;Short Term: Will quit all tobacco product use, adhering to prevention of relapse plan.;Long Term: Complete abstinence from all tobacco products for at least 12 months from quit date.    Hypertension Yes    Intervention Provide education on lifestyle modifcations including regular physical activity/exercise, weight management, moderate sodium restriction and increased consumption of fresh fruit, vegetables, and low fat dairy, alcohol moderation, and smoking cessation.;Monitor prescription use compliance.    Expected Outcomes Short Term: Continued assessment and intervention until BP is < 140/66mm HG in hypertensive participants. < 130/30mm HG in hypertensive participants with diabetes, heart failure or chronic kidney disease.;Long Term: Maintenance of blood pressure at goal levels.    Lipids Yes    Intervention Provide education and support for participant on nutrition & aerobic/resistive exercise along with prescribed medications to achieve LDL 70mg , HDL >40mg .    Expected Outcomes Short Term: Participant states understanding of desired cholesterol values and is compliant with  medications prescribed. Participant is following exercise prescription and nutrition guidelines.;Long Term: Cholesterol controlled with medications as prescribed, with individualized exercise RX and with personalized nutrition plan. Value goals: LDL < 70mg , HDL > 40 mg.             Education:Diabetes - Individual verbal and written instruction to review signs/symptoms of diabetes, desired ranges of glucose level fasting, after meals and with exercise. Acknowledge that pre and post exercise glucose checks will be done for 3 sessions at entry of program.   Core Components/Risk Factors/Patient Goals Review:   Goals and Risk Factor Review     Row Name 12/11/22 0805             Core Components/Risk Factors/Patient Goals Review   Personal Goals Review Weight Management/Obesity;Tobacco Cessation;Hypertension       Review Zalia is doing well in rehab. She states that she would like to lose some weight. She weighed 191.1 lbs today, but states that she has a goal weight of 175 lb. Marrin states that she has not been checking her BP at home, and she does not own a BP cuff. We encouraged her to purchase a BP cuff and begin to monitor her blood pressure. Tani states that she has continued to not smoke, and has not smoked since having her stent put in.       Expected Outcomes Short: Begin to monitor BP  at home. Long: Continue to monitor lifestyle risk factors.                Core Components/Risk Factors/Patient Goals at Discharge (Final Review):   Goals and Risk Factor Review - 12/11/22 0805       Core Components/Risk Factors/Patient Goals Review   Personal Goals Review Weight Management/Obesity;Tobacco Cessation;Hypertension    Review Miyanna is doing well in rehab. She states that she would like to lose some weight. She weighed 191.1 lbs today, but states that she has a goal weight of 175 lb. Icess states that she has not been checking her BP at home, and she does not own a BP cuff. We  encouraged her to purchase a BP cuff and begin to monitor her blood pressure. Alin states that she has continued to not smoke, and has not smoked since having her stent put in.    Expected Outcomes Short: Begin to monitor BP at home. Long: Continue to monitor lifestyle risk factors.             ITP Comments:  ITP Comments     Row Name 11/27/22 0954 11/28/22 1234 12/02/22 0820 12/09/22 1501 12/25/22 0926   ITP Comments Initial phone call completed. Diagnosis [facility] in [facility] 12/17. EP Orientation scheduled for Thursday 2/22 at 9:30. Completed 6MWT and gym orientation. Initial ITP created and sent for review to Dr. Emily Filbert, Medical Director. First full day of exercise!  Patient was oriented to gym and equipment including functions, settings, policies, and procedures.  Patient's individual exercise prescription and treatment plan were reviewed.  All starting workloads were established based on the results of the 6 minute walk test done at initial orientation visit.  The plan for exercise progression was also introduced and progression will be customized based on patient's performance and goals. Completed intitial RD consultation 30 Day review completed. Medical Director ITP review done, changes made as directed, and signed approval by Medical Director. new to program            Comments:

## 2022-12-27 ENCOUNTER — Encounter: Payer: PRIVATE HEALTH INSURANCE | Admitting: *Deleted

## 2022-12-27 DIAGNOSIS — I252 Old myocardial infarction: Secondary | ICD-10-CM | POA: Diagnosis not present

## 2022-12-27 DIAGNOSIS — I214 Non-ST elevation (NSTEMI) myocardial infarction: Secondary | ICD-10-CM

## 2022-12-27 DIAGNOSIS — Z955 Presence of coronary angioplasty implant and graft: Secondary | ICD-10-CM

## 2022-12-27 NOTE — Progress Notes (Signed)
Daily Session Note  Patient Details  Name: ONEDIA NIEMEIER MRN: [id number] Date of Birth: 1966-06-19 Referring Provider:   Flowsheet Row Cardiac Rehab from 11/28/2022 in [facility] Cardiac and Pulmonary Rehab  Referring Provider Dr. Lujean Amel, MD       Encounter Date: 12/27/2022  Check In:  Session Check In - 12/27/22 0809       Check-In   Location [facility]-Cardiac & Pulmonary Rehab    Staff Present Heath Lark, RN, BSN, CCRP;Jessica Anderson, MA, RCEP, CCRP, CCET;Joseph [place], [place]    Virtual Visit No    Medication changes reported     No    Fall or balance concerns reported    No    Warm-up and Cool-down Performed on first and last piece of equipment    Resistance Training Performed Yes    VAD Patient? No    PAD/SET Patient? No      Pain Assessment   Currently in Pain? No/denies                Social History   Tobacco Use  Smoking Status Former   Packs/day: 1.00   Years: 7.00   Additional pack years: 0.00   Total pack years: 7.00   Types: Cigarettes   Start date: 09/22/2022  Smokeless Tobacco Never    Goals Met:  Independence with exercise equipment Exercise tolerated well No report of concerns or symptoms today  Goals Unmet:  Not Applicable  Comments: Pt able to follow exercise prescription today without complaint.  Will continue to monitor for progression.    Dr. Emily Filbert is Medical Director for [place].  Dr. Ottie Glazier is Medical Director for [facility] Pulmonary Rehabilitation.

## 2022-12-30 ENCOUNTER — Encounter: Payer: PRIVATE HEALTH INSURANCE | Admitting: *Deleted

## 2022-12-30 DIAGNOSIS — Z955 Presence of coronary angioplasty implant and graft: Secondary | ICD-10-CM

## 2022-12-30 DIAGNOSIS — I214 Non-ST elevation (NSTEMI) myocardial infarction: Secondary | ICD-10-CM

## 2022-12-30 DIAGNOSIS — I252 Old myocardial infarction: Secondary | ICD-10-CM | POA: Diagnosis not present

## 2022-12-30 NOTE — Progress Notes (Signed)
Daily Session Note  Patient Details  Name: ELERI TINER MRN: [id number] Date of Birth: 1966/01/09 Referring Provider:   Flowsheet Row Cardiac Rehab from 11/28/2022 in [facility] Cardiac and Pulmonary Rehab  Referring Provider Dr. Lujean Amel, MD       Encounter Date: 12/30/2022  Check In:  Session Check In - 12/30/22 0752       Check-In   Supervising physician immediately available to respond to emergencies See telemetry face sheet for immediately available ER MD    Location [facility]-Cardiac & Pulmonary Rehab    Staff Present Darlyne Russian, RN, Doyce Para, BS, ACSM CEP, Exercise Physiologist;Joseph Tessie Fass, [place]    Virtual Visit No    Medication changes reported     No    Fall or balance concerns reported    No    Warm-up and Cool-down Performed on first and last piece of equipment    Resistance Training Performed Yes    VAD Patient? No    PAD/SET Patient? No      Pain Assessment   Currently in Pain? No/denies                Social History   Tobacco Use  Smoking Status Former   Packs/day: 1.00   Years: 7.00   Additional pack years: 0.00   Total pack years: 7.00   Types: Cigarettes   Start date: 09/22/2022  Smokeless Tobacco Never    Goals Met:  Independence with exercise equipment Exercise tolerated well No report of concerns or symptoms today Strength training completed today  Goals Unmet:  Not Applicable  Comments: Pt able to follow exercise prescription today without complaint.  Will continue to monitor for progression.  Reviewed home exercise with pt today.  Pt plans to walk and has joined the [facility] for exercise.  Reviewed THR, pulse, RPE, sign and symptoms, pulse oximetery and when to call 911 or MD.  Also discussed weather considerations and indoor options.  Pt voiced understanding.   Dr. Emily Filbert is Medical Director for [place].  Dr. Ottie Glazier is Medical Director for [facility] Pulmonary  Rehabilitation.

## 2023-01-01 ENCOUNTER — Encounter: Payer: PRIVATE HEALTH INSURANCE | Admitting: *Deleted

## 2023-01-01 DIAGNOSIS — Z955 Presence of coronary angioplasty implant and graft: Secondary | ICD-10-CM

## 2023-01-01 DIAGNOSIS — I252 Old myocardial infarction: Secondary | ICD-10-CM | POA: Diagnosis not present

## 2023-01-01 DIAGNOSIS — I214 Non-ST elevation (NSTEMI) myocardial infarction: Secondary | ICD-10-CM

## 2023-01-01 NOTE — Progress Notes (Signed)
Daily Session Note  Patient Details  Name: Jeanette Ball MRN: [id number] Date of Birth: 08/02/1966 Referring Provider:   Flowsheet Row Cardiac Rehab from 11/28/2022 in [facility] Cardiac and Pulmonary Rehab  Referring Provider Dr. Lujean Amel, MD       Encounter Date: 01/01/2023  Check In:  Session Check In - 01/01/23 0803       Check-In   Supervising physician immediately available to respond to emergencies See telemetry face sheet for immediately available ER MD    Location [facility]-Cardiac & Pulmonary Rehab    Staff Present Justin Mend, RCP,RRT,BSRT;Noah Tickle, BS, Exercise Physiologist;Megan Tamala Julian, RN, Diamond Nickel RN, BSN    Virtual Visit No    Medication changes reported     No    Fall or balance concerns reported    No    Tobacco Cessation No Change    Warm-up and Cool-down Performed on first and last piece of equipment    Resistance Training Performed Yes    VAD Patient? No    PAD/SET Patient? No      Pain Assessment   Currently in Pain? No/denies                Social History   Tobacco Use  Smoking Status Former   Packs/day: 1.00   Years: 7.00   Additional pack years: 0.00   Total pack years: 7.00   Types: Cigarettes   Start date: 09/22/2022  Smokeless Tobacco Never    Goals Met:  Independence with exercise equipment Exercise tolerated well No report of concerns or symptoms today Strength training completed today  Goals Unmet:  Not Applicable  Comments: Pt able to follow exercise prescription today without complaint.  Will continue to monitor for progression.    Dr. Emily Filbert is Medical Director for [place].  Dr. Ottie Glazier is Medical Director for [facility] Pulmonary Rehabilitation.

## 2023-01-03 ENCOUNTER — Encounter: Payer: PRIVATE HEALTH INSURANCE | Admitting: *Deleted

## 2023-01-03 DIAGNOSIS — I252 Old myocardial infarction: Secondary | ICD-10-CM | POA: Diagnosis not present

## 2023-01-03 DIAGNOSIS — Z955 Presence of coronary angioplasty implant and graft: Secondary | ICD-10-CM

## 2023-01-03 DIAGNOSIS — I214 Non-ST elevation (NSTEMI) myocardial infarction: Secondary | ICD-10-CM

## 2023-01-03 NOTE — Progress Notes (Signed)
Daily Session Note  Patient Details  Name: Jeanette Ball MRN: [id number] Date of Birth: May 22, 1966 Referring Provider:   Flowsheet Row Cardiac Rehab from 11/28/2022 in [facility] Cardiac and Pulmonary Rehab  Referring Provider Dr. Lujean Amel, MD       Encounter Date: 01/03/2023  Check In:  Session Check In - 01/03/23 0837       Check-In   Supervising physician immediately available to respond to emergencies See telemetry face sheet for immediately available ER MD    Location [facility]-Cardiac & Pulmonary Rehab    Staff Present Heath Lark, RN, BSN, CCRP;Krista Frederico Hamman RN, BSN;Joseph [place], [place]    Virtual Visit No    Medication changes reported     No    Fall or balance concerns reported    No    Warm-up and Cool-down Performed on first and last piece of equipment    Resistance Training Performed Yes    VAD Patient? No    PAD/SET Patient? No      Pain Assessment   Currently in Pain? No/denies                Social History   Tobacco Use  Smoking Status Former   Packs/day: 1.00   Years: 7.00   Additional pack years: 0.00   Total pack years: 7.00   Types: Cigarettes   Start date: 09/22/2022  Smokeless Tobacco Never    Goals Met:  Independence with exercise equipment Exercise tolerated well No report of concerns or symptoms today  Goals Unmet:  Not Applicable  Comments: Pt able to follow exercise prescription today without complaint.  Will continue to monitor for progression.    Dr. Emily Filbert is Medical Director for [place].  Dr. Ottie Glazier is Medical Director for [facility] Pulmonary Rehabilitation.

## 2023-01-06 ENCOUNTER — Encounter: Payer: PRIVATE HEALTH INSURANCE | Attending: Internal Medicine | Admitting: *Deleted

## 2023-01-06 DIAGNOSIS — Z955 Presence of coronary angioplasty implant and graft: Secondary | ICD-10-CM | POA: Diagnosis present

## 2023-01-06 DIAGNOSIS — I214 Non-ST elevation (NSTEMI) myocardial infarction: Secondary | ICD-10-CM | POA: Diagnosis present

## 2023-01-06 NOTE — Progress Notes (Signed)
Daily Session Note  Patient Details  Name: Jeanette Ball MRN: [id number] Date of Birth: September 03, 1966 Referring Provider:   Flowsheet Row Cardiac Rehab from 11/28/2022 in [facility] Cardiac and Pulmonary Rehab  Referring Provider Dr. Lujean Amel, MD       Encounter Date: 01/06/2023  Check In:  Session Check In - 01/06/23 0929       Check-In   Supervising physician immediately available to respond to emergencies See telemetry face sheet for immediately available ER MD    Location [facility]-Cardiac & Pulmonary Rehab    Staff Present Darlyne Russian, RN, Doyce Para, BS, ACSM CEP, Exercise Physiologist;Joseph Tessie Fass, [place]    Virtual Visit No    Medication changes reported     No    Fall or balance concerns reported    No    Warm-up and Cool-down Performed on first and last piece of equipment    Resistance Training Performed Yes    VAD Patient? No    PAD/SET Patient? No      Pain Assessment   Currently in Pain? No/denies                Social History   Tobacco Use  Smoking Status Former   Packs/day: 1.00   Years: 7.00   Additional pack years: 0.00   Total pack years: 7.00   Types: Cigarettes   Start date: 09/22/2022  Smokeless Tobacco Never    Goals Met:  Independence with exercise equipment Exercise tolerated well No report of concerns or symptoms today Strength training completed today  Goals Unmet:  Not Applicable  Comments: Pt able to follow exercise prescription today without complaint.  Will continue to monitor for progression.    Dr. Emily Filbert is Medical Director for [place].  Dr. Ottie Glazier is Medical Director for [facility] Pulmonary Rehabilitation.

## 2023-01-08 ENCOUNTER — Encounter: Payer: PRIVATE HEALTH INSURANCE | Admitting: *Deleted

## 2023-01-08 DIAGNOSIS — I214 Non-ST elevation (NSTEMI) myocardial infarction: Secondary | ICD-10-CM | POA: Diagnosis not present

## 2023-01-08 DIAGNOSIS — Z955 Presence of coronary angioplasty implant and graft: Secondary | ICD-10-CM

## 2023-01-08 NOTE — Progress Notes (Signed)
Daily Session Note  Patient Details  Name: Jeanette Ball MRN: [id number] Date of Birth: 06-26-1966 Referring Provider:   Flowsheet Row Cardiac Rehab from 11/28/2022 in [facility] Cardiac and Pulmonary Rehab  Referring Provider Dr. Lujean Amel, MD       Encounter Date: 01/08/2023  Check In:  Session Check In - 01/08/23 0751       Check-In   Supervising physician immediately available to respond to emergencies See telemetry face sheet for immediately available ER MD    Location [facility]-Cardiac & Pulmonary Rehab    Staff Present Darlyne Russian, RN, ADN;Joseph Tessie Fass, RCP,RRT,BSRT;Noah Tickle, BS, Exercise Physiologist    Virtual Visit No    Medication changes reported     No    Fall or balance concerns reported    No    Warm-up and Cool-down Performed on first and last piece of equipment    Resistance Training Performed Yes    VAD Patient? No    PAD/SET Patient? No      Pain Assessment   Currently in Pain? No/denies                Social History   Tobacco Use  Smoking Status Former   Packs/day: 1.00   Years: 7.00   Additional pack years: 0.00   Total pack years: 7.00   Types: Cigarettes   Start date: 09/22/2022  Smokeless Tobacco Never    Goals Met:  Independence with exercise equipment Exercise tolerated well No report of concerns or symptoms today Strength training completed today  Goals Unmet:  Not Applicable  Comments: Pt able to follow exercise prescription today without complaint.  Will continue to monitor for progression.    Dr. Emily Filbert is Medical Director for [place].  Dr. Ottie Glazier is Medical Director for [facility] Pulmonary Rehabilitation.

## 2023-01-10 DIAGNOSIS — Z955 Presence of coronary angioplasty implant and graft: Secondary | ICD-10-CM

## 2023-01-10 DIAGNOSIS — I214 Non-ST elevation (NSTEMI) myocardial infarction: Secondary | ICD-10-CM | POA: Diagnosis not present

## 2023-01-10 NOTE — Progress Notes (Signed)
Daily Session Note  Patient Details  Name: Jeanette Ball MRN: [id number] Date of Birth: January 13, 1966 Referring Provider:   Flowsheet Row Cardiac Rehab from 11/28/2022 in [facility] Cardiac and Pulmonary Rehab  Referring Provider Dr. Dorothyann Peng, MD       Encounter Date: 01/10/2023  Check In:  Session Check In - 01/10/23 0754       Check-In   Supervising physician immediately available to respond to emergencies See telemetry face sheet for immediately available ER MD    Location [facility]-Cardiac & Pulmonary Rehab    Staff Present Darcel Bayley, RN,BC,MSN;Joseph [place], RCP,RRT,BSRT;Noah [place], [place], Exercise Physiologist    Virtual Visit No    Medication changes reported     No    Fall or balance concerns reported    No    Tobacco Cessation No Change    Warm-up and Cool-down Performed on first and last piece of equipment    Resistance Training Performed Yes    VAD Patient? No    PAD/SET Patient? No      Pain Assessment   Currently in Pain? No/denies    Multiple Pain Sites No                Social History   Tobacco Use  Smoking Status Former   Packs/day: 1.00   Years: 7.00   Additional pack years: 0.00   Total pack years: 7.00   Types: Cigarettes   Start date: 09/22/2022  Smokeless Tobacco Never    Goals Met:  Independence with exercise equipment Exercise tolerated well No report of concerns or symptoms today  Goals Unmet:  Not Applicable  Comments: Pt able to follow exercise prescription today without complaint.  Will continue to monitor for progression.    Dr. Bethann Punches is Medical Director for [facility] Cardiac Rehabilitation.  Dr. Vida Rigger is Medical Director for [facility] Pulmonary Rehabilitation.

## 2023-01-13 ENCOUNTER — Encounter: Payer: PRIVATE HEALTH INSURANCE | Admitting: *Deleted

## 2023-01-13 DIAGNOSIS — I214 Non-ST elevation (NSTEMI) myocardial infarction: Secondary | ICD-10-CM | POA: Diagnosis not present

## 2023-01-13 DIAGNOSIS — Z955 Presence of coronary angioplasty implant and graft: Secondary | ICD-10-CM

## 2023-01-13 NOTE — Progress Notes (Signed)
Daily Session Note  Patient Details  Name: Jeanette Ball MRN: [id number] Date of Birth: 31-Oct-1965 Referring Provider:   Flowsheet Row Cardiac Rehab from 11/28/2022 in [facility] Cardiac and Pulmonary Rehab  Referring Provider Dr. Dorothyann Peng, MD       Encounter Date: 01/13/2023  Check In:  Session Check In - 01/13/23 0758       Check-In   Supervising physician immediately available to respond to emergencies See telemetry face sheet for immediately available ER MD    Location [facility]-Cardiac & Pulmonary Rehab    Staff Present Lanny Hurst, RN, Franki Monte, BS, ACSM CEP, Exercise Physiologist;Joseph Reino [place], [place]    Virtual Visit No    Medication changes reported     No    Fall or balance concerns reported    No    Warm-up and Cool-down Performed on first and last piece of equipment    Resistance Training Performed Yes    VAD Patient? No    PAD/SET Patient? No      Pain Assessment   Currently in Pain? No/denies                Social History   Tobacco Use  Smoking Status Former   Packs/day: 1.00   Years: 7.00   Additional pack years: 0.00   Total pack years: 7.00   Types: Cigarettes   Start date: 09/22/2022  Smokeless Tobacco Never    Goals Met:  Independence with exercise equipment Exercise tolerated well No report of concerns or symptoms today Strength training completed today  Goals Unmet:  Not Applicable  Comments: Pt able to follow exercise prescription today without complaint.  Will continue to monitor for progression.    Dr. Bethann Punches is Medical Director for [facility] Cardiac Rehabilitation.  Dr. Vida Rigger is Medical Director for [facility] Pulmonary Rehabilitation.

## 2023-01-15 ENCOUNTER — Encounter: Payer: PRIVATE HEALTH INSURANCE | Admitting: *Deleted

## 2023-01-15 DIAGNOSIS — Z955 Presence of coronary angioplasty implant and graft: Secondary | ICD-10-CM

## 2023-01-15 DIAGNOSIS — I214 Non-ST elevation (NSTEMI) myocardial infarction: Secondary | ICD-10-CM

## 2023-01-15 NOTE — Progress Notes (Signed)
Daily Session Note  Patient Details  Name: Jeanette Ball MRN: [id number] Date of Birth: Apr 26, 1966 Referring Provider:   Flowsheet Row Cardiac Rehab from 11/28/2022 in [facility] Cardiac and Pulmonary Rehab  Referring Provider Dr. Dorothyann Peng, MD       Encounter Date: 01/15/2023  Check In:  Session Check In - 01/15/23 0749       Check-In   Supervising physician immediately available to respond to emergencies See telemetry face sheet for immediately available ER MD    Location [facility]-Cardiac & Pulmonary Rehab    Staff Present Lanny Hurst, RN, ADN;Joseph Reino [place], RCP,RRT,BSRT;Noah Tickle, BS, Exercise Physiologist    Virtual Visit No    Medication changes reported     No    Fall or balance concerns reported    No    Warm-up and Cool-down Performed on first and last piece of equipment    Resistance Training Performed Yes    VAD Patient? No    PAD/SET Patient? No      Pain Assessment   Currently in Pain? No/denies                Social History   Tobacco Use  Smoking Status Former   Packs/day: 1.00   Years: 7.00   Additional pack years: 0.00   Total pack years: 7.00   Types: Cigarettes   Start date: 09/22/2022  Smokeless Tobacco Never    Goals Met:  Independence with exercise equipment Exercise tolerated well No report of concerns or symptoms today Strength training completed today  Goals Unmet:  Not Applicable  Comments: Pt able to follow exercise prescription today without complaint.  Will continue to monitor for progression.    Dr. Bethann Punches is Medical Director for [facility] Cardiac Rehabilitation.  Dr. Vida Rigger is Medical Director for [facility] Pulmonary Rehabilitation.

## 2023-01-17 ENCOUNTER — Encounter: Payer: PRIVATE HEALTH INSURANCE | Admitting: *Deleted

## 2023-01-17 DIAGNOSIS — I214 Non-ST elevation (NSTEMI) myocardial infarction: Secondary | ICD-10-CM | POA: Diagnosis not present

## 2023-01-17 DIAGNOSIS — Z955 Presence of coronary angioplasty implant and graft: Secondary | ICD-10-CM

## 2023-01-17 NOTE — Progress Notes (Signed)
Daily Session Note  Patient Details  Name: Jeanette Ball MRN: [id number] Date of Birth: 23-Apr-1966 Referring Provider:   Flowsheet Row Cardiac Rehab from 11/28/2022 in [facility] Cardiac and Pulmonary Rehab  Referring Provider Dr. Dorothyann Peng, MD       Encounter Date: 01/17/2023  Check In:  Session Check In - 01/17/23 0755       Check-In   Supervising physician immediately available to respond to emergencies See telemetry face sheet for immediately available ER MD    Location [facility]-Cardiac & Pulmonary Rehab    Staff Present Elige Ko, RCP,RRT,BSRT;Harlene Ramus, RN, BSN;[place], MA, RCEP, CCRP, CCET    Virtual Visit No    Medication changes reported     No    Fall or balance concerns reported    No    Tobacco Cessation No Change    Warm-up and Cool-down Performed on first and last piece of equipment    Resistance Training Performed Yes    VAD Patient? No    PAD/SET Patient? No      Pain Assessment   Currently in Pain? No/denies                Social History   Tobacco Use  Smoking Status Former   Packs/day: 1.00   Years: 7.00   Additional pack years: 0.00   Total pack years: 7.00   Types: Cigarettes   Start date: 09/22/2022  Smokeless Tobacco Never    Goals Met:  Proper associated with RPD/PD & O2 Sat Independence with exercise equipment Using PLB without cueing & demonstrates good technique Exercise tolerated well No report of concerns or symptoms today Strength training completed today  Goals Unmet:  Not Applicable  Comments: Pt able to follow exercise prescription today without complaint.  Will continue to monitor for progression.   Dr. Bethann Punches is Medical Director for [facility] Cardiac Rehabilitation.  Dr. Vida Rigger is Medical Director for [facility] Pulmonary Rehabilitation.

## 2023-01-20 ENCOUNTER — Encounter: Payer: PRIVATE HEALTH INSURANCE | Admitting: *Deleted

## 2023-01-20 VITALS — Ht 68.5 in | Wt 190.6 lb

## 2023-01-20 DIAGNOSIS — Z955 Presence of coronary angioplasty implant and graft: Secondary | ICD-10-CM

## 2023-01-20 DIAGNOSIS — I214 Non-ST elevation (NSTEMI) myocardial infarction: Secondary | ICD-10-CM

## 2023-01-20 NOTE — Patient Instructions (Signed)
Discharge Patient Instructions  Patient Details  Name: Jeanette Ball MRN: [id number] Date of Birth: 06-Jan-1966 Referring Provider:  Cassell Clement, MD   Number of Visits: 40  Reason for Discharge:  Patient reached a stable level of exercise. Patient independent in their exercise. Patient has met program and personal goals.  Smoking History:  Social History   Tobacco Use  Smoking Status Former   Packs/day: 1.00   Years: 7.00   Additional pack years: 0.00   Total pack years: 7.00   Types: Cigarettes   Start date: 09/22/2022  Smokeless Tobacco Never    Diagnosis:  NSTEMI (non-ST elevated myocardial infarction)  Status post coronary artery stent placement  Initial Exercise Prescription:  Initial Exercise Prescription - 11/28/22 1300       Date of Initial Exercise RX and Referring Provider   Date 11/28/22    Referring Provider Dr. Dorothyann Peng, MD      Oxygen   Maintain Oxygen Saturation 88% or higher      Treadmill   MPH 2.4    Grade 1    Minutes 15    METs 3.17      Recumbant Bike   Level 3    RPM 50    Watts 54    Minutes 15    METs 3.97      Recumbant Elliptical   Level 1.5    RPM 50    Minutes 15    METs 3.97      REL-XR   Level 3    Speed 50    Minutes 15    METs 3.97      Prescription Details   Frequency (times per week) 3    Duration Progress to 30 minutes of continuous aerobic without signs/symptoms of physical distress      Intensity   THRR 40-80% of Max Heartrate 107-146    Ratings of Perceived Exertion 11-13    Perceived Dyspnea 0-4      Progression   Progression Continue to progress workloads to maintain intensity without signs/symptoms of physical distress.      Resistance Training   Training Prescription Yes    Weight 3 lb    Reps 10-15             Discharge Exercise Prescription (Final Exercise Prescription Changes):  Exercise Prescription Changes - 01/09/23 1200       Response to Exercise    Blood Pressure (Admit) 140/80    Blood Pressure (Exit) 120/72    Heart Rate (Admit) 78 bpm    Heart Rate (Exercise) 132 bpm    Heart Rate (Exit) 91 bpm    Rating of Perceived Exertion (Exercise) 15    Symptoms none    Duration Continue with 30 min of aerobic exercise without signs/symptoms of physical distress.    Intensity THRR unchanged      Progression   Progression Continue to progress workloads to maintain intensity without signs/symptoms of physical distress.    Average METs 5.44      Resistance Training   Training Prescription Yes    Weight 4 lb    Reps 10-15      Interval Training   Interval Training No      Treadmill   MPH 3    Grade 6    Minutes 15    METs 5.78      NuStep   Level 6    Minutes 15    METs 5.5      Recumbant  Elliptical   Level 5    Minutes 15    METs 1.8      REL-XR   Level 12    Minutes 15    METs 8      Home Exercise Plan   Plans to continue exercise at Jeanette Ball    Frequency Add 2 additional days to program exercise sessions.    Initial Home Exercises Provided 12/30/22      Oxygen   Maintain Oxygen Saturation 88% or higher             Functional Capacity:  6 Minute Walk     Row Name 11/28/22 1253 01/20/23 0810       6 Minute Walk   Phase Initial Discharge    Distance 1415 feet 1535 feet    Distance % Change -- 8.5 %    Distance Feet Change -- 120 ft    Walk Time 6 minutes 6 minutes    # of Rest Breaks 0 0    MPH 2.68 2.91    METS 3.97 3.94    RPE 9 11    Perceived Dyspnea  0 0    VO2 Peak 13.88 13.81    Symptoms No No    Resting HR 68 bpm 88 bpm    Resting BP 126/68 102/60    Resting Oxygen Saturation  99 % 98 %    Exercise Oxygen Saturation  during 6 min walk 98 % 96 %    Max Ex. HR 94 bpm 95 bpm    Max Ex. BP 144/82 110/62    2 Minute Post BP 124/72 --             Nutrition & Weight - Outcomes:  Pre Biometrics - 11/28/22 1304       Pre Biometrics   Height 5' 8.5" (1.74 m)    Weight  190 lb 8 oz (86.4 kg)    Waist Circumference 37 inches    Hip Circumference 42 inches    Waist to Hip Ratio 0.88 %    BMI (Calculated) 28.54    Single Leg Stand 16.7 seconds             Post Biometrics - 01/20/23 4315        Post  Biometrics   Height 5' 8.5" (1.74 m)    Weight 190 lb 9.6 oz (86.5 kg)    Waist Circumference 36 inches    Hip Circumference 43 inches    Waist to Hip Ratio 0.84 %    BMI (Calculated) 28.56    Single Leg Stand 30 seconds             Nutrition:  Nutrition Therapy & Goals - 12/09/22 1333       Nutrition Therapy   Diet Heart healthy, low Na    Drug/Food Interactions Statins/Certain Fruits    Protein (specify units) 95g    Fiber 30 grams    Whole Grain Foods 3 servings    Saturated Fats 16 max. grams    Fruits and Vegetables 8 servings/day    Sodium 2 grams      Personal Nutrition Goals   Nutrition Goal ST: make wrap/sandwich at home with whole wheat bread/wraps for lunch, add black beans to rice, add protein, fiber, and fat to snacks such as peanut butter to rice cakes with fruit, swap butter for avocado oil. LT: follow MyPlate guidelines, limit eating out <1-2x/week, include at least 30g of fiber/day, limit saturated fat <  16g/day    Comments Initial Assessment: 57 y.o. F admitted to cardiac rehab s/p NSTEMI with stent. PMHx includes CAD, HLD, HTN, anxiety, tobacco use. Reviewed relevant medications: alprazolam (xanax), citalopram (celexa), rosuvastatin (crestor). Reviewed most recent labwork. Anthropometrics: Ht: 5'8.5" Wt: 86.4 kg BMI: 28.54. PYP Score: 34. Lifestyle: Jeanette Ball reports that she used to be more active before COVID, but still stays busy caring for her grandchild and mother. She would like to begin structured exercise outside of rehab as well. Food recall:Meal 1: nabs or rice cakes when going to rehab. Boiled egg and scrambled egg at home, but will have egg and cheese out with no bread. Meal 2: Goes out to get a sub that she usually  splits to have for dinner as well (tomato, [place], cheese, cucumber, banana pepper, pickles, onions, and mayo on flatbread) Meal 3: baked chicken, squash, broccoli, cauliflower Snack 1-3: she will have snacks with her grandson such as fruit snacks or sugar-free popciles, but for herself she will buy peanuts, trailmix. Drinks: red bull every once in a while, water and sometimes diet soda - she has cut down on redbull and soda; she reports thats she used to drink it often during the day. Common foods/practices: Added fat: she adds butter to her food most of the time which she knows she should limit. She does not like the flavor of olive oil when she tried it previously. Added salt: she does not salt her food aside from some seasoning blends. Eating out: most days. Red meat: <1-2x/week. Comments: Dung reports she used to always bake her foods instead of frying and eating vegetables, but after getting home from the hospital she had changes in taste that has been improving - she still eats baked chicken and rice, but eats out often and feels that she has been eating more snack items. Emilyanne would like to cut down on butter and cook more at home. Education: Provided Heart healthy education; reviewing MyPlate, types of fat, sodium, added sugar, nutrition label. Provided meal planning/shopping education; reviewing how to build flavor and easy meals/swaps. Suggested she add protein, fiber, and some fat to most meals and snacks; examples includes peanut butter with fruit to ricecake, add black beans to rice, swap butter for avocado oil (in moderation - can use reusable spray bottle), and make whole wheat wraps or sandwiches at home for lunch at least 3-4x/week with lots of cooked and/or raw vegetables and lean protein such as leftover baked chicken.      Intervention Plan   Intervention Prescribe, educate and counsel regarding individualized specific dietary modifications aiming towards targeted core components such as  weight, hypertension, lipid management, diabetes, heart failure and other comorbidities.;Nutrition handout(s) given to patient.    Expected Outcomes Short Term Goal: Understand basic principles of dietary content, such as calories, fat, sodium, cholesterol and nutrients.;Short Term Goal: A plan has been developed with personal nutrition goals set during dietitian appointment.;Long Term Goal: Adherence to prescribed nutrition plan.                Goals reviewed with patient; copy given to patient.

## 2023-01-20 NOTE — Progress Notes (Signed)
Daily Session Note  Patient Details  Name: CHLORIS ZUKAUSKAS MRN: [id number] Date of Birth: 1966/01/15 Referring Provider:   Flowsheet Row Cardiac Rehab from 11/28/2022 in [facility] Cardiac and Pulmonary Rehab  Referring Provider Dr. Dorothyann Peng, MD       Encounter Date: 01/20/2023  Check In:  Session Check In - 01/20/23 0749       Check-In   Supervising physician immediately available to respond to emergencies See telemetry face sheet for immediately available ER MD    Location [facility]-Cardiac & Pulmonary Rehab    Staff Present Lanny Hurst, RN, Franki Monte, BS, ACSM CEP, Exercise Physiologist;Joseph Reino [place], [place]    Virtual Visit No    Medication changes reported     No    Fall or balance concerns reported    No    Warm-up and Cool-down Performed on first and last piece of equipment    Resistance Training Performed Yes    VAD Patient? No    PAD/SET Patient? No      Pain Assessment   Currently in Pain? No/denies                Social History   Tobacco Use  Smoking Status Former   Packs/day: 1.00   Years: 7.00   Additional pack years: 0.00   Total pack years: 7.00   Types: Cigarettes   Start date: 09/22/2022  Smokeless Tobacco Never    Goals Met:  Independence with exercise equipment Exercise tolerated well No report of concerns or symptoms today Strength training completed today  Goals Unmet:  Not Applicable  Comments: Pt able to follow exercise prescription today without complaint.  Will continue to monitor for progression.   6 Minute Walk     Row Name 11/28/22 1253 01/20/23 0810       6 Minute Walk   Phase Initial Discharge    Distance 1415 feet 1535 feet    Distance % Change -- 8.5 %    Distance Feet Change -- 120 ft    Walk Time 6 minutes 6 minutes    # of Rest Breaks 0 0    MPH 2.68 2.91    METS 3.97 3.94    RPE 9 11    Perceived Dyspnea  0 0    VO2 Peak 13.88 13.81    Symptoms No No    Resting HR 68 bpm 88 bpm    Resting BP  126/68 102/60    Resting Oxygen Saturation  99 % 98 %    Exercise Oxygen Saturation  during 6 min walk 98 % 96 %    Max Ex. HR 94 bpm 95 bpm    Max Ex. BP 144/82 110/62    2 Minute Post BP 124/72 --                Dr. Bethann Punches is Medical Director for [facility] Cardiac Rehabilitation.  Dr. Vida Rigger is Medical Director for [facility] Pulmonary Rehabilitation.

## 2023-01-22 ENCOUNTER — Encounter: Payer: Self-pay | Admitting: *Deleted

## 2023-01-22 ENCOUNTER — Encounter: Payer: PRIVATE HEALTH INSURANCE | Admitting: *Deleted

## 2023-01-22 DIAGNOSIS — I214 Non-ST elevation (NSTEMI) myocardial infarction: Secondary | ICD-10-CM | POA: Diagnosis not present

## 2023-01-22 DIAGNOSIS — Z955 Presence of coronary angioplasty implant and graft: Secondary | ICD-10-CM

## 2023-01-22 NOTE — Progress Notes (Signed)
Daily Session Note  Patient Details  Name: Jeanette Ball MRN: [id number] Date of Birth: Nov 13, 1965 Referring Provider:   Flowsheet Row Cardiac Rehab from 11/28/2022 in [facility] Cardiac and Pulmonary Rehab  Referring Provider Dr. Dorothyann Peng, MD       Encounter Date: 01/22/2023  Check In:  Session Check In - 01/22/23 0751       Check-In   Supervising physician immediately available to respond to emergencies See telemetry face sheet for immediately available ER MD    Location [facility]-Cardiac & Pulmonary Rehab    Staff Present Lanny Hurst, RN, ADN;Joseph Reino [place], RCP,RRT,BSRT;Noah Tickle, BS, Exercise Physiologist    Virtual Visit No    Medication changes reported     No    Fall or balance concerns reported    No    Warm-up and Cool-down Performed on first and last piece of equipment    Resistance Training Performed Yes    VAD Patient? No    PAD/SET Patient? No      Pain Assessment   Currently in Pain? No/denies                Social History   Tobacco Use  Smoking Status Former   Packs/day: 1.00   Years: 7.00   Additional pack years: 0.00   Total pack years: 7.00   Types: Cigarettes   Start date: 09/22/2022  Smokeless Tobacco Never    Goals Met:  Independence with exercise equipment Exercise tolerated well No report of concerns or symptoms today Strength training completed today  Goals Unmet:  Not Applicable  Comments: Pt able to follow exercise prescription today without complaint.  Will continue to monitor for progression.    Dr. Bethann Punches is Medical Director for [facility] Cardiac Rehabilitation.  Dr. Vida Rigger is Medical Director for [facility] Pulmonary Rehabilitation.

## 2023-01-22 NOTE — Progress Notes (Signed)
Cardiac Individual Treatment Plan  Patient Details  Name: Jeanette Ball MRN: [id number] Date of Birth: 02-25-1966 Referring Provider:   Flowsheet Row Cardiac Rehab from 11/28/2022 in [facility] Cardiac and Pulmonary Rehab  Referring Provider Dr. Dorothyann Peng, MD       Initial Encounter Date:  Flowsheet Row Cardiac Rehab from 11/28/2022 in [facility] Cardiac and Pulmonary Rehab  Date 11/28/22       Visit Diagnosis: NSTEMI (non-ST elevated myocardial infarction)  Status post coronary artery stent placement  Patient's Home Medications on Admission:  Current Outpatient Medications:    ALPRAZolam (XANAX) 0.5 MG tablet, Take by mouth., Disp: , Rfl:    aspirin 81 MG chewable tablet, Chew 1 tablet (81 mg total) by mouth daily. (Patient not taking: Reported on 11/27/2022), Disp: 30 tablet, Rfl: 0   clopidogrel (PLAVIX) 75 MG tablet, Take 1 tablet (75 mg total) by mouth daily., Disp: 30 tablet, Rfl: 0   losartan (COZAAR) 50 MG tablet, Take 1 tablet (50 mg total) by mouth daily., Disp: 30 tablet, Rfl: 0   metoprolol tartrate (LOPRESSOR) 25 MG tablet, Take 1 tablet (25 mg total) by mouth 2 (two) times daily. (Patient not taking: Reported on 11/27/2022), Disp: 60 tablet, Rfl: 0   nicotine (NICODERM CQ - DOSED IN MG/24 HOURS) 21 mg/24hr patch, Place 1 patch (21 mg total) onto the skin daily as needed (nicotine craving). (Patient not taking: Reported on 11/27/2022), Disp: 28 patch, Rfl: 0   rosuvastatin (CRESTOR) 40 MG tablet, Take 1 tablet (40 mg total) by mouth daily., Disp: 30 tablet, Rfl: 0  Past Medical History: Past Medical History:  Diagnosis Date   Alcohol use    Tobacco use     Tobacco Use: Social History   Tobacco Use  Smoking Status Former   Packs/day: 1.00   Years: 7.00   Additional pack years: 0.00   Total pack years: 7.00   Types: Cigarettes   Start date: 09/22/2022  Smokeless Tobacco Never    Labs: Review Flowsheet       Latest Ref Rng & Units 09/23/2022  Labs for  [facility] Cardiac and Pulmonary Rehab  Cholestrol 0 - 200 mg/dL 409   LDL (calc) 0 - 99 mg/dL 811   HDL-C >91 mg/dL 50   Trlycerides <478 mg/dL 295      Exercise Target Goals: Exercise Program Goal: Individual exercise prescription set using results from initial 6 min walk test and THRR while considering  patient's activity barriers and safety.   Exercise Prescription Goal: Initial exercise prescription builds to 30-45 minutes a day of aerobic activity, 2-3 days per week.  Home exercise guidelines will be given to patient during program as part of exercise prescription that the participant will acknowledge.   Education: Aerobic Exercise: - Group verbal and visual presentation on the components of exercise prescription. Introduces F.I.T.T principle from ACSM for exercise prescriptions.  Reviews F.I.T.T. principles of aerobic exercise including progression. Written material given at graduation. Flowsheet Row Cardiac Rehab from 01/22/2023 in [facility] Cardiac and Pulmonary Rehab  Date 01/01/23  Educator [facility]  Instruction Review Code 1- Verbalizes Understanding       Education: Resistance Exercise: - Group verbal and visual presentation on the components of exercise prescription. Introduces F.I.T.T principle from ACSM for exercise prescriptions  Reviews F.I.T.T. principles of resistance exercise including progression. Written material given at graduation. Flowsheet Row Cardiac Rehab from 01/22/2023 in [facility] Cardiac and Pulmonary Rehab  Date 01/08/23  Educator KW  Instruction Review Code 1- Verbalizes Understanding  Education: Exercise & Equipment Safety: - Individual verbal instruction and demonstration of equipment use and safety with use of the equipment. Flowsheet Row Cardiac Rehab from 01/22/2023 in [facility] Cardiac and Pulmonary Rehab  Date 11/28/22  Educator NT  Instruction Review Code 1- Verbalizes Understanding       Education: Exercise Physiology & General Exercise Guidelines: -  Group verbal and written instruction with models to review the exercise physiology of the cardiovascular system and associated critical values. Provides general exercise guidelines with specific guidelines to those with heart or lung disease.    Education: Flexibility, Balance, Mind/Body Relaxation: - Group verbal and visual presentation with interactive activity on the components of exercise prescription. Introduces F.I.T.T principle from ACSM for exercise prescriptions. Reviews F.I.T.T. principles of flexibility and balance exercise training including progression. Also discusses the mind body connection.  Reviews various relaxation techniques to help reduce and manage stress (i.e. Deep breathing, progressive muscle relaxation, and visualization). Balance handout provided to take home. Written material given at graduation. Flowsheet Row Cardiac Rehab from 01/22/2023 in [facility] Cardiac and Pulmonary Rehab  Date 01/08/23  Educator KW  Instruction Review Code 1- Verbalizes Understanding       Activity Barriers & Risk Stratification:  Activity Barriers & Cardiac Risk Stratification - 11/28/22 1304       Activity Barriers & Cardiac Risk Stratification   Activity Barriers None    Cardiac Risk Stratification Moderate             6 Minute Walk:  6 Minute Walk     Row Name 11/28/22 1253 01/20/23 0810       6 Minute Walk   Phase Initial Discharge    Distance 1415 feet 1535 feet    Distance % Change -- 8.5 %    Distance Feet Change -- 120 ft    Walk Time 6 minutes 6 minutes    # of Rest Breaks 0 0    MPH 2.68 2.91    METS 3.97 3.94    RPE 9 11    Perceived Dyspnea  0 0    VO2 Peak 13.88 13.81    Symptoms No No    Resting HR 68 bpm 88 bpm    Resting BP 126/68 102/60    Resting Oxygen Saturation  99 % 98 %    Exercise Oxygen Saturation  during 6 min walk 98 % 96 %    Max Ex. HR 94 bpm 95 bpm    Max Ex. BP 144/82 110/62    2 Minute Post BP 124/72 --             Oxygen  Initial Assessment:   Oxygen Re-Evaluation:   Oxygen Discharge (Final Oxygen Re-Evaluation):   Initial Exercise Prescription:  Initial Exercise Prescription - 11/28/22 1300       Date of Initial Exercise RX and Referring Provider   Date 11/28/22    Referring Provider Dr. Dorothyann Peng, MD      Oxygen   Maintain Oxygen Saturation 88% or higher      Treadmill   MPH 2.4    Grade 1    Minutes 15    METs 3.17      Recumbant Bike   Level 3    RPM 50    Watts 54    Minutes 15    METs 3.97      Recumbant Elliptical   Level 1.5    RPM 50    Minutes 15    METs 3.97  REL-XR   Level 3    Speed 50    Minutes 15    METs 3.97      Prescription Details   Frequency (times per week) 3    Duration Progress to 30 minutes of continuous aerobic without signs/symptoms of physical distress      Intensity   THRR 40-80% of Max Heartrate 107-146    Ratings of Perceived Exertion 11-13    Perceived Dyspnea 0-4      Progression   Progression Continue to progress workloads to maintain intensity without signs/symptoms of physical distress.      Resistance Training   Training Prescription Yes    Weight 3 lb    Reps 10-15             Perform Capillary Blood Glucose checks as needed.  Exercise Prescription Changes:   Exercise Prescription Changes     Row Name 11/28/22 1300 12/10/22 1600 12/25/22 1600 12/30/22 0800 01/09/23 1200     Response to Exercise   Blood Pressure (Admit) 126/68 104/62 122/62 -- 140/80   Blood Pressure (Exercise) 144/82 118/60 140/60 -- --   Blood Pressure (Exit) 124/72 110/62 134/60 -- 120/72   Heart Rate (Admit) 68 bpm 84 bpm 88 bpm -- 78 bpm   Heart Rate (Exercise) 94 bpm 140 bpm 137 bpm -- 132 bpm   Heart Rate (Exit) 69 bpm 99 bpm 106 bpm -- 91 bpm   Oxygen Saturation (Admit) 99 % -- -- -- --   Oxygen Saturation (Exercise) 98 % -- -- -- --   Rating of Perceived Exertion (Exercise) -- 15   Perceived Dyspnea (Exercise) 0 -- --  -- --   Symptoms None none none -- none   Comments Results 2nd full week of exercise -- -- --   Duration -- Continue with 30 min of aerobic exercise without signs/symptoms of physical distress. Continue with 30 min of aerobic exercise without signs/symptoms of physical distress. -- Continue with 30 min of aerobic exercise without signs/symptoms of physical distress.   Intensity -- THRR unchanged THRR unchanged -- THRR unchanged     Progression   Progression -- Continue to progress workloads to maintain intensity without signs/symptoms of physical distress. Continue to progress workloads to maintain intensity without signs/symptoms of physical distress. -- Continue to progress workloads to maintain intensity without signs/symptoms of physical distress.   Average METs -- 3.49 4.23 -- 5.44     Resistance Training   Training Prescription -- Yes Yes -- Yes   Weight -- 3 lb 3 lb -- 4 lb   Reps -- 10-15 10-15 -- 10-15     Interval Training   Interval Training -- No No -- No     Treadmill   MPH -- 2.6 3 -- 3   Grade -- 2.5 3 -- 6   Minutes -- 15 15 -- 15   METs -- 3.89 4.54 -- 5.78     Recumbant Bike   Level -- 3 6 -- --   Watts -- 27 50 -- --   Minutes -- 15 15 -- --   METs -- 2.97 3.81 -- --     NuStep   Level -- -- -- -- 6   Minutes -- -- -- -- 15   METs -- -- -- -- 5.5     Recumbant Elliptical   Level -- 3 -- -- 5   Minutes -- 15 -- -- 15   METs -- 2.5 -- --  1.8     REL-XR   Level -- 4 10 -- 12   Minutes -- 15 15 -- 15   METs -- 5 6.3 -- 8     T5 Nustep   Level -- -- 3 -- --   Minutes -- -- 15 -- --   METs -- -- 2.3 -- --     Home Exercise Plan   Plans to continue exercise at -- -- -- [facility]   Frequency -- -- -- Add 2 additional days to program exercise sessions. Add 2 additional days to program exercise sessions.   Initial Home Exercises Provided -- -- -- 12/30/22 12/30/22     Oxygen   Maintain Oxygen Saturation -- 88% or  higher 88% or higher -- 88% or higher            Exercise Comments:   Exercise Comments     Row Name 12/02/22 0820           Exercise Comments First full day of exercise!  Patient was oriented to gym and equipment including functions, settings, policies, and procedures.  Patient's individual exercise prescription and treatment plan were reviewed.  All starting workloads were established based on the results of the 6 minute walk test done at initial orientation visit.  The plan for exercise progression was also introduced and progression will be customized based on patient's performance and goals.                Exercise Goals and Review:   Exercise Goals     Row Name 11/28/22 1239             Exercise Goals   Increase Physical Activity Yes       Intervention Provide advice, education, support and counseling about physical activity/exercise needs.;Develop an individualized exercise prescription for aerobic and resistive training based on initial evaluation findings, risk stratification, comorbidities and participant's personal goals.       Expected Outcomes Short Term: Attend rehab on a regular basis to increase amount of physical activity.;Long Term: Exercising regularly at least 3-5 days a week.;Long Term: Add in home exercise to make exercise part of routine and to increase amount of physical activity.       Increase Strength and Stamina Yes       Intervention Develop an individualized exercise prescription for aerobic and resistive training based on initial evaluation findings, risk stratification, comorbidities and participant's personal goals.;Provide advice, education, support and counseling about physical activity/exercise needs.       Expected Outcomes Short Term: Increase workloads from initial exercise prescription for resistance, speed, and METs.;Short Term: Perform resistance training exercises routinely during rehab and add in resistance training at home;Long  Term: Improve cardiorespiratory fitness, muscular endurance and strength as measured by increased METs and functional capacity ( )       Able to understand and use rate of perceived exertion (RPE) scale Yes       Intervention Provide education and explanation on how to use RPE scale       Expected Outcomes Short Term: Able to use RPE daily in rehab to express subjective intensity level;Long Term:  Able to use RPE to guide intensity level when exercising independently       Able to understand and use Dyspnea scale Yes       Intervention Provide education and explanation on how to use Dyspnea scale       Expected Outcomes Short Term: Able to use  Dyspnea scale daily in rehab to express subjective sense of shortness of breath during exertion;Long Term: Able to use Dyspnea scale to guide intensity level when exercising independently       Knowledge and understanding of Target Heart Rate Range (THRR) Yes       Intervention Provide education and explanation of THRR including how the numbers were predicted and where they are located for reference       Expected Outcomes Short Term: Able to state/look up THRR;Short Term: Able to use daily as guideline for intensity in rehab;Long Term: Able to use THRR to govern intensity when exercising independently       Able to check pulse independently Yes       Intervention Provide education and demonstration on how to check pulse in carotid and radial arteries.;Review the importance of being able to check your own pulse for safety during independent exercise       Expected Outcomes Short Term: Able to explain why pulse checking is important during independent exercise;Long Term: Able to check pulse independently and accurately       Understanding of Exercise Prescription Yes       Intervention Provide education, explanation, and written materials on patient's individual exercise prescription       Expected Outcomes Short Term: Able to explain program exercise  prescription;Long Term: Able to explain home exercise prescription to exercise independently                Exercise Goals Re-Evaluation :  Exercise Goals Re-Evaluation     Row Name 12/02/22 0820 12/09/22 1518 12/10/22 1603 12/25/22 1605 12/30/22 0801     Exercise Goal Re-Evaluation   Exercise Goals Review Able to understand and use rate of perceived exertion (RPE) scale;Able to understand and use Dyspnea scale;Knowledge and understanding of Target Heart Rate Range (THRR);Understanding of Exercise Prescription Able to understand and use rate of perceived exertion (RPE) scale;Able to understand and use Dyspnea scale;Knowledge and understanding of Target Heart Rate Range (THRR);Understanding of Exercise Prescription Increase Physical Activity;Increase Strength and Stamina;Understanding of Exercise Prescription Increase Physical Activity;Increase Strength and Stamina;Understanding of Exercise Prescription Increase Physical Activity;Increase Strength and Stamina;Understanding of Exercise Prescription;Able to understand and use rate of perceived exertion (RPE) scale;Able to understand and use Dyspnea scale;Knowledge and understanding of Target Heart Rate Range (THRR);Able to check pulse independently   Comments Reviewed RPE scale, THR and program prescription with pt today.  Pt voiced understanding and was given a copy of goals to take home. Livvy reports being active at home caring for her grandchild and mother, but is not as active as she was pre-COVID. She reports that she would like to start exercising outside of rehab, reviewed general recommendations for aerobic exercise 150 minutes/week (~30 minutes for 5 days/week) - she would like to start walking outside which she feels would help her physical and mental health. She has not reviewed home exercise with EP yet as she began rehab last week. Daria is doing well in rehab for her first couple of weeks she has been here. She has followed her initial  exercise prescription well and was even able to increase to level 4 on the XR and up to a 2% incline on the treadmill. She has been reaching her THR and reporting appropriate RPEs. We will continue to monitor as she progresses in the program. Tom continues to do well in the program. She recently increased her overall average MET level to 4.23 METs. She also increased her treadmill workload  to a speed of 3 mph with an incline of 3%. She improved to level 10 on the XR and level 6 on the recumbent bike as well. She has continued to do well with 3 lb hand weights for resistance training also. We will continue to monitor her progress in the program. Reviewed home exercise with pt today.  Pt plans to walk and has joined the [facility] for exercise.  Reviewed THR, pulse, RPE, sign and symptoms, pulse oximetery and when to call 911 or MD.  Also discussed weather considerations and indoor options.  Pt voiced understanding.   Expected Outcomes Short: Use RPE daily to regulate intensity. Long: Follow program prescription in THR. ST: walk outside tues/Thurs when not at rehab, EP to go over home exercise. LT: Complete aerobic exercise 150 minutes/week (~30 minutes for 5 days/week) Short: Continue to exercise at initial exercise prescription Long: Increase overall MET level and stamina Short: Try 4 lb hand weights for resistance training. Long: Continue to improve strength and stamina. Short: Start going to [place] on alternate days Long: Continue to exercise independently    Row Name 01/09/23 1204             Exercise Goal Re-Evaluation   Exercise Goals Review Increase Physical Activity;Increase Strength and Stamina;Understanding of Exercise Prescription       Comments Sheriann continues to do well in rehab. She has increased her overall MET level to 5! She also increased her incline on the treadmill to 6% and up to level 12 on the XR.She continues to reach her THR. We will continue to monitor.       Expected Outcomes  Short: Continue to increase T4 Nustep level Long: Continue to increase overall MET level and stamina                Discharge Exercise Prescription (Final Exercise Prescription Changes):  Exercise Prescription Changes - 01/09/23 1200       Response to Exercise   Blood Pressure (Admit) 140/80    Blood Pressure (Exit) 120/72    Heart Rate (Admit) 78 bpm    Heart Rate (Exercise) 132 bpm    Heart Rate (Exit) 91 bpm    Rating of Perceived Exertion (Exercise) 15    Symptoms none    Duration Continue with 30 min of aerobic exercise without signs/symptoms of physical distress.    Intensity THRR unchanged      Progression   Progression Continue to progress workloads to maintain intensity without signs/symptoms of physical distress.    Average METs 5.44      Resistance Training   Training Prescription Yes    Weight 4 lb    Reps 10-15      Interval Training   Interval Training No      Treadmill   MPH 3    Grade 6    Minutes 15    METs 5.78      NuStep   Level 6    Minutes 15    METs 5.5      Recumbant Elliptical   Level 5    Minutes 15    METs 1.8      REL-XR   Level 12    Minutes 15    METs 8      Home Exercise Plan   Plans to continue exercise at [facility]    Frequency Add 2 additional days to program exercise sessions.    Initial Home Exercises Provided 12/30/22  Oxygen   Maintain Oxygen Saturation 88% or higher             Nutrition:  Target Goals: Understanding of nutrition guidelines, daily intake of sodium 1500mg , cholesterol 200mg , calories 30% from fat and 7% or less from saturated fats, daily to have 5 or more servings of fruits and vegetables.  Education: All About Nutrition: -Group instruction provided by verbal, written material, interactive activities, discussions, models, and posters to present general guidelines for heart healthy nutrition including fat, fiber, MyPlate, the role of sodium in heart healthy nutrition,  utilization of the nutrition label, and utilization of this knowledge for meal planning. Follow up email sent as well. Written material given at graduation. Flowsheet Row Cardiac Rehab from 01/22/2023 in [facility] Cardiac and Pulmonary Rehab  Education need identified 11/28/22  Date 01/22/23  [Part 2]  Educator [facility]  Instruction Review Code 1- Verbalizes Understanding       Biometrics:  Pre Biometrics - 11/28/22 1304       Pre Biometrics   Height 5' 8.5" (1.74 m)    Weight 190 lb 8 oz (86.4 kg)    Waist Circumference 37 inches    Hip Circumference 42 inches    Waist to Hip Ratio 0.88 %    BMI (Calculated) 28.54    Single Leg Stand 16.7 seconds             Post Biometrics - 01/20/23 4540        Post  Biometrics   Height 5' 8.5" (1.74 m)    Weight 190 lb 9.6 oz (86.5 kg)    Waist Circumference 36 inches    Hip Circumference 43 inches    Waist to Hip Ratio 0.84 %    BMI (Calculated) 28.56    Single Leg Stand 30 seconds             Nutrition Therapy Plan and Nutrition Goals:  Nutrition Therapy & Goals - 12/09/22 1333       Nutrition Therapy   Diet Heart healthy, low Na    Drug/Food Interactions Statins/Certain Fruits    Protein (specify units) 95g    Fiber 30 grams    Whole Grain Foods 3 servings    Saturated Fats 16 max. grams    Fruits and Vegetables 8 servings/day    Sodium 2 grams      Personal Nutrition Goals   Nutrition Goal ST: make wrap/sandwich at home with whole wheat bread/wraps for lunch, add black beans to rice, add protein, fiber, and fat to snacks such as peanut butter to rice cakes with fruit, swap butter for avocado oil. LT: follow MyPlate guidelines, limit eating out <1-2x/week, include at least 30g of fiber/day, limit saturated fat <16g/day    Comments Initial Assessment: 57 y.o. F admitted to cardiac rehab s/p NSTEMI with stent. PMHx includes CAD, HLD, HTN, anxiety, tobacco use. Reviewed relevant medications: alprazolam (xanax), citalopram  (celexa), rosuvastatin (crestor). Reviewed most recent labwork. Anthropometrics: Ht: 5'8.5" Wt: 86.4 kg BMI: 28.54. PYP Score: 34. Lifestyle: Raelee reports that she used to be more active before COVID, but still stays busy caring for her grandchild and mother. She would like to begin structured exercise outside of rehab as well. Food recall:Meal 1: nabs or rice cakes when going to rehab. Boiled egg and scrambled egg at home, but will have egg and cheese out with no bread. Meal 2: Goes out to get a sub that she usually splits to have for dinner as well (tomato, [place],  cheese, cucumber, banana pepper, pickles, onions, and mayo on flatbread) Meal 3: baked chicken, squash, broccoli, cauliflower Snack 1-3: she will have snacks with her grandson such as fruit snacks or sugar-free popciles, but for herself she will buy peanuts, trailmix. Drinks: red bull every once in a while, water and sometimes diet soda - she has cut down on redbull and soda; she reports thats she used to drink it often during the day. Common foods/practices: Added fat: she adds butter to her food most of the time which she knows she should limit. She does not like the flavor of olive oil when she tried it previously. Added salt: she does not salt her food aside from some seasoning blends. Eating out: most days. Red meat: <1-2x/week. Comments: Suleyma reports she used to always bake her foods instead of frying and eating vegetables, but after getting home from the hospital she had changes in taste that has been improving - she still eats baked chicken and rice, but eats out often and feels that she has been eating more snack items. Tawn would like to cut down on butter and cook more at home. Education: Provided Heart healthy education; reviewing MyPlate, types of fat, sodium, added sugar, nutrition label. Provided meal planning/shopping education; reviewing how to build flavor and easy meals/swaps. Suggested she add protein, fiber, and some fat to most  meals and snacks; examples includes peanut butter with fruit to ricecake, add black beans to rice, swap butter for avocado oil (in moderation - can use reusable spray bottle), and make whole wheat wraps or sandwiches at home for lunch at least 3-4x/week with lots of cooked and/or raw vegetables and lean protein such as leftover baked chicken.      Intervention Plan   Intervention Prescribe, educate and counsel regarding individualized specific dietary modifications aiming towards targeted core components such as weight, hypertension, lipid management, diabetes, heart failure and other comorbidities.;Nutrition handout(s) given to patient.    Expected Outcomes Short Term Goal: Understand basic principles of dietary content, such as calories, fat, sodium, cholesterol and nutrients.;Short Term Goal: A plan has been developed with personal nutrition goals set during dietitian appointment.;Long Term Goal: Adherence to prescribed nutrition plan.             Nutrition Assessments:  MEDIFICTS Score Key: ?70 Need to make dietary changes  40-70 Heart Healthy Diet ? 40 Therapeutic Level Cholesterol Diet  Flowsheet Row Cardiac Rehab from 01/22/2023 in [facility] Cardiac and Pulmonary Rehab  Picture Your Plate Total Score on Discharge 47      Picture Your Plate Scores: <96 Unhealthy dietary pattern with much room for improvement. 41-50 Dietary pattern unlikely to meet recommendations for good health and room for improvement. 51-60 More healthful dietary pattern, with some room for improvement.  >60 Healthy dietary pattern, although there may be some specific behaviors that could be improved.    Nutrition Goals Re-Evaluation:  Nutrition Goals Re-Evaluation     Row Name 01/08/23 0752             Goals   Current Weight 192 lb (87.1 kg)       Nutrition Goal lose more weight       Comment Etna has been eating more wheat bread and making healthier choices. She is making more baked foods and eating  more yogurt. She has been eating more fruit like oranges and blueberries.       Expected Outcome Short: lose a few pounds. Long: reach her weight goal.  Nutrition Goals Discharge (Final Nutrition Goals Re-Evaluation):  Nutrition Goals Re-Evaluation - 01/08/23 0752       Goals   Current Weight 192 lb (87.1 kg)    Nutrition Goal lose more weight    Comment Jessly has been eating more wheat bread and making healthier choices. She is making more baked foods and eating more yogurt. She has been eating more fruit like oranges and blueberries.    Expected Outcome Short: lose a few pounds. Long: reach her weight goal.             Psychosocial: Target Goals: Acknowledge presence or absence of significant depression and/or stress, maximize coping skills, provide positive support system. Participant is able to verbalize types and ability to use techniques and skills needed for reducing stress and depression.   Education: Stress, Anxiety, and Depression - Group verbal and visual presentation to define topics covered.  Reviews how body is impacted by stress, anxiety, and depression.  Also discusses healthy ways to reduce stress and to treat/manage anxiety and depression.  Written material given at graduation. Flowsheet Row Cardiac Rehab from 01/22/2023 in [facility] Cardiac and Pulmonary Rehab  Date 12/18/22  Educator [facility]  Instruction Review Code 1- [employer] Understanding       Education: Sleep Hygiene -Provides group verbal and written instruction about how sleep can affect your health.  Define sleep hygiene, discuss sleep cycles and impact of sleep habits. Review good sleep hygiene tips.    Initial Review & Psychosocial Screening:  Initial Psych Review & Screening - 11/27/22 1002       Initial Review   Current issues with Current Anxiety/Panic;Current Depression;Current Stress Concerns    Comments Health concerns, replaying MI, noticing increased anxiety and not feeling  like it can be controlled at times      Family Dynamics   Good Support System? Yes      Barriers   Psychosocial barriers to participate in program The patient should benefit from training in stress management and relaxation.      Screening Interventions   Interventions Encouraged to exercise;Provide feedback about the scores to participant;To provide support and resources with identified psychosocial needs    Expected Outcomes Short Term goal: Utilizing psychosocial counselor, staff and physician to assist with identification of specific Stressors or current issues interfering with healing process. Setting desired goal for each stressor or current issue identified.;Long Term Goal: Stressors or current issues are controlled or eliminated.;Short Term goal: Identification and review with participant of any Quality of Life or Depression concerns found by scoring the questionnaire.;Long Term goal: The participant improves quality of Life and PHQ9 Scores as seen by post scores and/or verbalization of changes             Quality of Life Scores:   Quality of Life - 01/22/23 0841       Quality of Life Scores   Health/Function Pre 9.4 %    Health/Function Post 13.63 %    Health/Function % Change 45 %    Socioeconomic Pre 13.13 %    Socioeconomic Post 14.43 %    Socioeconomic % Change  9.9 %    Psych/Spiritual Pre 22.29 %    Psych/Spiritual Post 16.29 %    Psych/Spiritual % Change -26.92 %    Family Pre 19.8 %    Family Post 16.9 %    Family % Change -14.65 %    GLOBAL Pre 14.31 %    GLOBAL Post 14.82 %    GLOBAL %  Change 3.56 %            Scores of 19 and below usually indicate a poorer quality of life in these areas.  A difference of  2-3 points is a clinically meaningful difference.  A difference of 2-3 points in the total score of the Quality of Life Index has been associated with significant improvement in overall quality of life, self-image, physical symptoms, and general  health in studies assessing change in quality of life.  PHQ-9: Review Flowsheet       01/22/2023 12/27/2022 11/28/2022  Depression screen PHQ 2/9  Decreased Interest 3 2 3   Down, Depressed, Hopeless 3 2 3   PHQ - 2 Score 6 4 6   Altered sleeping 2 0 2  Tired, decreased energy 1 1 3   Change in appetite 1 2 3   Feeling bad or failure about yourself  2 3 3   Trouble concentrating 3 2 3   Moving slowly or fidgety/restless 2 2 3   Suicidal thoughts 0 0 0  PHQ-9 Score 17 14 23   Difficult doing work/chores Very difficult Extremely dIfficult Extremely dIfficult   Interpretation of Total Score  Total Score Depression Severity:  1-4 = Minimal depression, 5-9 = Mild depression, 10-14 = Moderate depression, 15-19 = Moderately severe depression, 20-27 = Severe depression   Psychosocial Evaluation and Intervention:  Psychosocial Evaluation - 11/27/22 1021       Psychosocial Evaluation & Interventions   Interventions Encouraged to exercise with the program and follow exercise prescription;Stress management education;Relaxation education    Comments 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 She reports the mental load has been more than she thought it would be. When its quiet, she finds herself replaying the her MI events and the "what ifs." She retired from [employer] in 2021 to help care for her grandson. She did go back part time with [employer] but does not know if she wants to return because the mental stress of it all. She is interested in [employer] and hopes maybe she can find something part time from home. She quit smoking the day of her heart attack because she is scared to start back. She wants to focus on her mental health and is very ready to start the program to work on caring for herself.    Expected Outcomes Short: attend cardiac rehab for education and exercise. Long: develop and maintain positive self care habits.    Continue Psychosocial Services  Follow  up required by staff             Psychosocial Re-Evaluation:  Psychosocial Re-Evaluation     Row Name 12/11/22 0815 12/27/22 0723 01/08/23 0809         Psychosocial Re-Evaluation   Current issues with Current Stress Concerns;Current Anxiety/Panic Current Stress Concerns;Current Anxiety/Panic;Current Depression;Current Psychotropic Meds Current Stress Concerns;Current Anxiety/Panic;Current Depression;Current Psychotropic Meds     Comments Chae states that she has had a lot of stress related to family concerns. She has been taking care of her mother who has her own health needs. She is also stressed about not being able to work currently. She has also been overwhelmed with taking medications and her different appointments. She states that she does not have a healthy way to relieve stress but is looking for new ways to relax. She does enjoy putting her headphones in and listening to music and she also states that exercise has been a good stress reliever. Reviewed patient health questionnaire (PHQ-9) with  patient for follow up. Previously, patients score indicated signs/symptoms of depression.  Reviewed to see if patient is improving symptom wise while in program.  Score improved and patient states that it is because they have been moving more.  She is still taking her meds but is finding herself easily distracted.  She has a follow up with her PCP next week and was encouraged to talk to her about her depression symptoms to see about possibly increasing meds or changing them some to help.  She still just doesn't feel like herelf yet.  She was worried about it, but we reassured her that she is improving and well within normal feelings.  We also talked to her about using a pill box to help rememeber meds and to set them up on a schedule that will work best for her.  She attended stress management class last week too. Kathrine is doing well in the program. She states that sometimes she feels anxiety and gets  upset with herself that she had a heart attack. Informed her that she is making lifestyle changes for the better and that she can still do alot more living now that she is on track with her health. Her doctor wants her to talk to a therapist and she may do just that.     Expected Outcomes Short: Continue to look for healthy avenues of stress relief. Long: Continue to attend rehab for stress relief. Short: Talk to doctor about depression symptoms Long: Find routine that works for her for her medications Short: look into talking to a therapist. Long: maintain a healthy state of mind.     Interventions Encouraged to attend Cardiac Rehabilitation for the exercise Encouraged to attend Cardiac Rehabilitation for the exercise Encouraged to attend Cardiac Rehabilitation for the exercise     Continue Psychosocial Services  Follow up required by staff Follow up required by staff Follow up required by staff       Initial Review   Source of Stress Concerns Family;Chronic Illness -- --     Comments Health concerns, replaying MI, noticing increased anxiety and not feeling like it can be controlled at times -- --              Psychosocial Discharge (Final Psychosocial Re-Evaluation):  Psychosocial Re-Evaluation - 01/08/23 0809       Psychosocial Re-Evaluation   Current issues with Current Stress Concerns;Current Anxiety/Panic;Current Depression;Current Psychotropic Meds    Comments Casondra is doing well in the program. She states that sometimes she feels anxiety and gets upset with herself that she had a heart attack. Informed her that she is making lifestyle changes for the better and that she can still do alot more living now that she is on track with her health. Her doctor wants her to talk to a therapist and she may do just that.    Expected Outcomes Short: look into talking to a therapist. Long: maintain a healthy state of mind.    Interventions Encouraged to attend Cardiac Rehabilitation for the exercise     Continue Psychosocial Services  Follow up required by staff             Vocational Rehabilitation: Provide vocational rehab assistance to qualifying candidates.   Vocational Rehab Evaluation & Intervention:  Vocational Rehab - 12/27/22 0735       Vocational Rehab Re-[place]   Comments 12/27/22 [place] met with vocational rehab.  She told them that she only knows [employer] and retired from there but had  gone back to work to supplement her income.  She told voc rehab that she was not mentally ready to return to work yet and was worried about retraining to learn something new.  We encouraged her to continue to work with vocational rehab if she does want to work to find something that will work for her.  She will keep [place] posted with updates.             Education: Education Goals: Education classes will be provided on a variety of topics geared toward better understanding of heart health and risk factor modification. Participant will state understanding/return demonstration of topics presented as noted by education test scores.  Learning Barriers/Preferences:  Learning Barriers/Preferences - 11/27/22 0957       Learning Barriers/Preferences   Learning Barriers None    Learning Preferences None             General Cardiac Education Topics:  AED/CPR: - Group verbal and written instruction with the use of models to demonstrate the basic use of the AED with the basic ABC's of resuscitation.   Anatomy and Cardiac Procedures: - Group verbal and visual presentation and models provide information about basic cardiac anatomy and function. Reviews the testing methods done to diagnose heart disease and the outcomes of the test results. Describes the treatment choices: Medical Management, Angioplasty, or Coronary Bypass Surgery for treating various heart conditions including Myocardial Infarction, Angina, Valve Disease, and Cardiac Arrhythmias.  Written material given at  graduation. Flowsheet Row Cardiac Rehab from 01/22/2023 in [facility] Cardiac and Pulmonary Rehab  Education need identified 11/28/22       Medication Safety: - Group verbal and visual instruction to review commonly prescribed medications for heart and lung disease. Reviews the medication, class of the drug, and side effects. Includes the steps to properly store meds and maintain the prescription regimen.  Written material given at graduation.   Intimacy: - Group verbal instruction through game format to discuss how heart and lung disease can affect sexual intimacy. Written material given at graduation.. Flowsheet Row Cardiac Rehab from 01/22/2023 in [facility] Cardiac and Pulmonary Rehab  Date 01/01/23  Educator [facility]  Instruction Review Code 1- Verbalizes Understanding       Know Your Numbers and Heart Failure: - Group verbal and visual instruction to discuss disease risk factors for cardiac and pulmonary disease and treatment options.  Reviews associated critical values for Overweight/Obesity, Hypertension, Cholesterol, and Diabetes.  Discusses basics of heart failure: signs/symptoms and treatments.  Introduces Heart Failure Zone chart for action plan for heart failure.  Written material given at graduation. Flowsheet Row Cardiac Rehab from 01/22/2023 in [facility] Cardiac and Pulmonary Rehab  Date 12/04/22  Educator [facility]  Instruction Review Code 1- Verbalizes Understanding       Infection Prevention: - Provides verbal and written material to individual with discussion of infection control including proper hand washing and proper equipment cleaning during exercise session. Flowsheet Row Cardiac Rehab from 01/22/2023 in [facility] Cardiac and Pulmonary Rehab  Date 11/28/22  Educator NT  Instruction Review Code 1- Verbalizes Understanding       Falls Prevention: - Provides verbal and written material to individual with discussion of falls prevention and safety. Flowsheet Row Cardiac Rehab from 01/22/2023  in [facility] Cardiac and Pulmonary Rehab  Date 11/28/22  Educator NT  Instruction Review Code 1- Verbalizes Understanding       Other: -Provides group and verbal instruction on various topics (see comments)   Knowledge Questionnaire Score:  Knowledge Questionnaire Score - 01/22/23 0840       Knowledge Questionnaire Score   Pre Score 21/26    Post Score 24/26             Core Components/Risk Factors/Patient Goals at Admission:  Personal Goals and Risk Factors at Admission - 11/27/22 0954       Core Components/Risk Factors/Patient Goals on Admission    Weight Management Yes;Weight Loss    Intervention Weight Management: Develop a combined nutrition and exercise program designed to reach desired caloric intake, while maintaining appropriate intake of nutrient and fiber, sodium and fats, and appropriate energy expenditure required for the weight goal.;Weight Management: Provide education and appropriate resources to help participant work on and attain dietary goals.;Weight Management/Obesity: Establish reasonable short term and long term weight goals.    Expected Outcomes Short Term: Continue to assess and modify interventions until short term weight is achieved;Long Term: Adherence to nutrition and physical activity/exercise program aimed toward attainment of established weight goal;Weight Loss: Understanding of general recommendations for a balanced deficit meal plan, which promotes 1-2 lb weight loss per week and includes a negative energy balance of [phone] kcal/d;Understanding recommendations for meals to include 15-35% energy as protein, 25-35% energy from fat, 35-60% energy from carbohydrates, less than 200mg  of dietary cholesterol, 20-35 gm of total fiber daily;Understanding of distribution of calorie intake throughout the day with the consumption of 4-5 meals/snacks    Tobacco Cessation Yes   Quit 09/22/22   Intervention Assist the participant in steps to quit. Provide  individualized education and counseling about committing to Tobacco Cessation, relapse prevention, and pharmacological support that can be provided by physician.;Education officer, environmental, assist with locating and accessing local/national Quit Smoking programs, and support quit date choice.    Expected Outcomes Short Term: Will demonstrate readiness to quit, by selecting a quit date.;Short Term: Will quit all tobacco product use, adhering to prevention of relapse plan.;Long Term: Complete abstinence from all tobacco products for at least 12 months from quit date.    Hypertension Yes    Intervention Provide education on lifestyle modifcations including regular physical activity/exercise, weight management, moderate sodium restriction and increased consumption of fresh fruit, vegetables, and low fat dairy, alcohol moderation, and smoking cessation.;Monitor prescription use compliance.    Expected Outcomes Short Term: Continued assessment and intervention until BP is < 140/52mm HG in hypertensive participants. < 130/67mm HG in hypertensive participants with diabetes, heart failure or chronic kidney disease.;Long Term: Maintenance of blood pressure at goal levels.    Lipids Yes    Intervention Provide education and support for participant on nutrition & aerobic/resistive exercise along with prescribed medications to achieve LDL 70mg , HDL >40mg .    Expected Outcomes Short Term: Participant states understanding of desired cholesterol values and is compliant with medications prescribed. Participant is following exercise prescription and nutrition guidelines.;Long Term: Cholesterol controlled with medications as prescribed, with individualized exercise RX and with personalized nutrition plan. Value goals: LDL < 70mg , HDL > 40 mg.             Education:Diabetes - Individual verbal and written instruction to review signs/symptoms of diabetes, desired ranges of glucose level fasting, after meals and with  exercise. Acknowledge that pre and post exercise glucose checks will be done for 3 sessions at entry of program.   Core Components/Risk Factors/Patient Goals Review:   Goals and Risk Factor Review     Row Name 12/11/22 0805 01/08/23 1610  Core Components/Risk Factors/Patient Goals Review   Personal Goals Review Weight Management/Obesity;Tobacco Cessation;Hypertension Weight Management/Obesity;Tobacco Cessation;Hypertension      Review Barbara is doing well in rehab. She states that she would like to lose some weight. She weighed 191.1 lbs today, but states that she has a goal weight of 175 lb. Tabithia states that she has not been checking her BP at home, and she does not own a BP cuff. We encouraged her to purchase a BP cuff and begin to monitor her blood pressure. Prisma states that she has continued to not smoke, and has not smoked since having her stent put in. Trenell is still tobacco free and plans to stay that way. She stopped smoking in December after her heart attack. Ja plans to stay done and not go back to using tobacco products.      Expected Outcomes Short: Begin to monitor BP at home. Long: Continue to monitor lifestyle risk factors. Short: continue to be tobacco free. Long: be six months of no smoking.               Core Components/Risk Factors/Patient Goals at Discharge (Final Review):   Goals and Risk Factor Review - 01/08/23 0754       Core Components/Risk Factors/Patient Goals Review   Personal Goals Review Weight Management/Obesity;Tobacco Cessation;Hypertension    Review Raedyn is still tobacco free and plans to stay that way. She stopped smoking in December after her heart attack. Ajahnae plans to stay done and not go back to using tobacco products.    Expected Outcomes Short: continue to be tobacco free. Long: be six months of no smoking.             ITP Comments:  ITP Comments     Row Name 11/27/22 0954 11/28/22 1234 12/02/22 0820 12/09/22 1501  12/25/22 0926   ITP Comments Initial phone call completed. Diagnosis can be found in [facility] 12/17. EP Orientation scheduled for Thursday 2/22 at 9:30. Completed and gym orientation. Initial ITP created and sent for review to Dr. Bethann Punches, Medical Director. First full day of exercise!  Patient was oriented to gym and equipment including functions, settings, policies, and procedures.  Patient's individual exercise prescription and treatment plan were reviewed.  All starting workloads were established based on the results of the 6 minute walk test done at initial orientation visit.  The plan for exercise progression was also introduced and progression will be customized based on patient's performance and goals. Completed intitial RD consultation 30 Day review completed. Medical Director ITP review done, changes made as directed, and signed approval by Medical Director. new to program    Row Name 01/22/23 1348           ITP Comments 30 day review completed. ITP sent to Dr. Bethann Punches, Medical Director of Cardiac Rehab. Continue with ITP unless changes are made by physician.                Comments: 30 day review

## 2023-01-24 DIAGNOSIS — I214 Non-ST elevation (NSTEMI) myocardial infarction: Secondary | ICD-10-CM

## 2023-01-24 DIAGNOSIS — Z955 Presence of coronary angioplasty implant and graft: Secondary | ICD-10-CM

## 2023-01-24 NOTE — Progress Notes (Signed)
Daily Session Note  Patient Details  Name: Jeanette Ball MRN: [id number] Date of Birth: 16-Dec-1965 Referring Provider:   Flowsheet Row Cardiac Rehab from 11/28/2022 in [facility] Cardiac and Pulmonary Rehab  Referring Provider Dr. Dorothyann Peng, MD       Encounter Date: 01/24/2023  Check In:  Session Check In - 01/24/23 0753       Check-In   Supervising physician immediately available to respond to emergencies See telemetry face sheet for immediately available ER MD    Location [facility]-Cardiac & Pulmonary Rehab    Staff Present Harlene Ramus, RN, BSN;Joseph [place], RCP,RRT,BSRT;Jessica [place], [place], RCEP, CCRP, CCET    Virtual Visit No    Medication changes reported     No    Fall or balance concerns reported    No    Tobacco Cessation No Change    Warm-up and Cool-down Performed on first and last piece of equipment    Resistance Training Performed Yes    VAD Patient? No    PAD/SET Patient? No      Pain Assessment   Currently in Pain? No/denies                Social History   Tobacco Use  Smoking Status Former   Packs/day: 1.00   Years: 7.00   Additional pack years: 0.00   Total pack years: 7.00   Types: Cigarettes   Start date: 09/22/2022  Smokeless Tobacco Never    Goals Met:  Independence with exercise equipment Exercise tolerated well No report of concerns or symptoms today Strength training completed today  Goals Unmet:  Not Applicable  Comments: Pt able to follow exercise prescription today without complaint.  Will continue to monitor for progression.   Dr. Bethann Punches is Medical Director for [facility] Cardiac Rehabilitation.  Dr. Vida Rigger is Medical Director for [facility] Pulmonary Rehabilitation.

## 2023-01-27 ENCOUNTER — Encounter: Payer: PRIVATE HEALTH INSURANCE | Admitting: *Deleted

## 2023-01-27 DIAGNOSIS — Z955 Presence of coronary angioplasty implant and graft: Secondary | ICD-10-CM

## 2023-01-27 DIAGNOSIS — I214 Non-ST elevation (NSTEMI) myocardial infarction: Secondary | ICD-10-CM | POA: Diagnosis not present

## 2023-01-27 NOTE — Progress Notes (Signed)
Daily Session Note  Patient Details  Name: Jeanette Ball MRN: [id number] Date of Birth: [birth year] Referring Provider:   Flowsheet Row Cardiac Rehab from 11/28/2022 in [facility]  Referring Provider Dr. Dorothyann Peng, MD       Encounter Date: 01/27/2023  Check In:  Session Check In - 01/27/23 0746       Check-In   Supervising physician immediately available to respond to emergencies See telemetry face sheet for immediately available ER MD    Location [facility]-Card[facility] Pul[facility]    Staff Present Lanny Hurst, RN, Franki Monte, BS, ACSM CEP, Exercise Physiologist;Joseph Reino [place], [place]    Virtual Visit No    Medication changes reported     No    Fall or balance concerns reported    No    Warm-up and Cool-down Performed on first and last piece of equipment    Resistance Training Performed Yes    VAD Patient? No    PAD/SET Patient? No      Pain Assessment   Currently in Pain? No/denies                Social History   Tobacco Use  Smoking Status Former   Packs/day: 1.00   Years: 7.00   Additional pack years: 0.00   Total pack years: 7.00   Types: Cigarettes   Start date: 09/22/2022  Smokeless Tobacco Never    Goals Met:  Independence with exercise equipment Exercise tolerated well No report of concerns or symptoms today Strength training completed today  Goals Unmet:  Not Applicable  Comments: Pt able to follow exercise prescription today without complaint.  Will continue to monitor for progression.    Dr. Bethann Punches is Medical Director for [facility] Cardiac Rehabilitation.  Dr. Vida Rigger is Medical Director for [facility] Pulmonary Rehabilitation.

## 2023-01-29 ENCOUNTER — Encounter: Payer: PRIVATE HEALTH INSURANCE | Admitting: *Deleted

## 2023-01-29 DIAGNOSIS — I214 Non-ST elevation (NSTEMI) myocardial infarction: Secondary | ICD-10-CM

## 2023-01-29 DIAGNOSIS — Z955 Presence of coronary angioplasty implant and graft: Secondary | ICD-10-CM

## 2023-01-29 NOTE — Progress Notes (Signed)
Discharge Summary: Jeanette Ball (DOB: 09-Jan-1966)  [name] graduated today from  rehab with 36 sessions completed.  Details of the patient's exercise prescription and what She needs to do in order to continue the prescription and progress were discussed with patient.  Patient was given a copy of prescription and goals.  Patient verbalized understanding.  [name] plans to continue to exercise by [facility].   6 Minute Walk     Row Name 11/28/22 1253 01/20/23 0810       6 Minute Walk   Phase Initial Discharge    Distance 1415 feet 1535 feet    Distance % Change -- 8.5 %    Distance Feet Change -- 120 ft    Walk Time 6 minutes 6 minutes    # of Rest Breaks 0 0    MPH 2.68 2.91    METS 3.97 3.94    RPE 9 11    Perceived Dyspnea  0 0    VO2 Peak 13.88 13.81    Symptoms No No    Resting HR 68 bpm 88 bpm    Resting BP 126/68 102/60    Resting Oxygen Saturation  99 % 98 %    Exercise Oxygen Saturation  during 6 min walk 98 % 96 %    Max Ex. HR 94 bpm 95 bpm    Max Ex. BP 144/82 110/62    2 Minute Post BP 124/72 --

## 2023-01-29 NOTE — Progress Notes (Signed)
Cardiac Individual Treatment Plan  Patient Details  Name: Jeanette Ball MRN: [id number] Date of Birth: Mar 31, 1966 Referring Provider:   Flowsheet Row Cardiac Rehab from 11/28/2022 in [facility] Cardiac and Pulmonary Rehab  Referring Provider Dr. Dorothyann Peng, MD       Initial Encounter Date:  Flowsheet Row Cardiac Rehab from 11/28/2022 in [facility] Cardiac and Pulmonary Rehab  Date 11/28/22       Visit Diagnosis: NSTEMI (non-ST elevated myocardial infarction)  Status post coronary artery stent placement  Patient's Home Medications on Admission:  Current Outpatient Medications:    ALPRAZolam (XANAX) 0.5 MG tablet, Take by mouth., Disp: , Rfl:    aspirin 81 MG chewable tablet, Chew 1 tablet (81 mg total) by mouth daily. (Patient not taking: Reported on 11/27/2022), Disp: 30 tablet, Rfl: 0   clopidogrel (PLAVIX) 75 MG tablet, Take 1 tablet (75 mg total) by mouth daily., Disp: 30 tablet, Rfl: 0   losartan (COZAAR) 50 MG tablet, Take 1 tablet (50 mg total) by mouth daily., Disp: 30 tablet, Rfl: 0   metoprolol tartrate (LOPRESSOR) 25 MG tablet, Take 1 tablet (25 mg total) by mouth 2 (two) times daily. (Patient not taking: Reported on 11/27/2022), Disp: 60 tablet, Rfl: 0   nicotine (NICODERM CQ - DOSED IN MG/24 HOURS) 21 mg/24hr patch, Place 1 patch (21 mg total) onto the skin daily as needed (nicotine craving). (Patient not taking: Reported on 11/27/2022), Disp: 28 patch, Rfl: 0   rosuvastatin (CRESTOR) 40 MG tablet, Take 1 tablet (40 mg total) by mouth daily., Disp: 30 tablet, Rfl: 0  Past Medical History: Past Medical History:  Diagnosis Date   Alcohol use    Tobacco use     Tobacco Use: Social History   Tobacco Use  Smoking Status Former   Packs/day: 1.00   Years: 7.00   Additional pack years: 0.00   Total pack years: 7.00   Types: Cigarettes   Start date: 09/22/2022  Smokeless Tobacco Never    Labs: Review Flowsheet       Latest Ref Rng & Units 09/23/2022  Labs for  [facility] Cardiac and Pulmonary Rehab  Cholestrol 0 - 200 mg/dL 409   LDL (calc) 0 - 99 mg/dL 811   HDL-C >91 mg/dL 50   Trlycerides <478 mg/dL 295      Exercise Target Goals: Exercise Program Goal: Individual exercise prescription set using results from initial 6 min walk test and THRR while considering  patient's activity barriers and safety.   Exercise Prescription Goal: Initial exercise prescription builds to 30-45 minutes a day of aerobic activity, 2-3 days per week.  Home exercise guidelines will be given to patient during program as part of exercise prescription that the participant will acknowledge.   Education: Aerobic Exercise: - Group verbal and visual presentation on the components of exercise prescription. Introduces F.I.T.T principle from ACSM for exercise prescriptions.  Reviews F.I.T.T. principles of aerobic exercise including progression. Written material given at graduation. Flowsheet Row Cardiac Rehab from 01/22/2023 in [facility] Cardiac and Pulmonary Rehab  Date 01/01/23  Educator [facility]  Instruction Review Code 1- Verbalizes Understanding       Education: Resistance Exercise: - Group verbal and visual presentation on the components of exercise prescription. Introduces F.I.T.T principle from ACSM for exercise prescriptions  Reviews F.I.T.T. principles of resistance exercise including progression. Written material given at graduation. Flowsheet Row Cardiac Rehab from 01/22/2023 in [facility] Cardiac and Pulmonary Rehab  Date 01/08/23  Educator KW  Instruction Review Code 1- Verbalizes Understanding  Education: Exercise & Equipment Safety: - Individual verbal instruction and demonstration of equipment use and safety with use of the equipment. Flowsheet Row Cardiac Rehab from 01/22/2023 in [facility] Cardiac and Pulmonary Rehab  Date 11/28/22  Educator NT  Instruction Review Code 1- Verbalizes Understanding       Education: Exercise Physiology & General Exercise Guidelines: -  Group verbal and written instruction with models to review the exercise physiology of the cardiovascular system and associated critical values. Provides general exercise guidelines with specific guidelines to those with heart or lung disease.    Education: Flexibility, Balance, Mind/Body Relaxation: - Group verbal and visual presentation with interactive activity on the components of exercise prescription. Introduces F.I.T.T principle from ACSM for exercise prescriptions. Reviews F.I.T.T. principles of flexibility and balance exercise training including progression. Also discusses the mind body connection.  Reviews various relaxation techniques to help reduce and manage stress (i.e. Deep breathing, progressive muscle relaxation, and visualization). Balance handout provided to take home. Written material given at graduation. Flowsheet Row Cardiac Rehab from 01/22/2023 in [facility] Cardiac and Pulmonary Rehab  Date 01/08/23  Educator KW  Instruction Review Code 1- Verbalizes Understanding       Activity Barriers & Risk Stratification:  Activity Barriers & Cardiac Risk Stratification - 11/28/22 1304       Activity Barriers & Cardiac Risk Stratification   Activity Barriers None    Cardiac Risk Stratification Moderate             6 Minute Walk:  6 Minute Walk     Row Name 11/28/22 1253 01/20/23 0810       6 Minute Walk   Phase Initial Discharge    Distance 1415 feet 1535 feet    Distance % Change -- 8.5 %    Distance Feet Change -- 120 ft    Walk Time 6 minutes 6 minutes    # of Rest Breaks 0 0    MPH 2.68 2.91    METS 3.97 3.94    RPE 9 11    Perceived Dyspnea  0 0    VO2 Peak 13.88 13.81    Symptoms No No    Resting HR 68 bpm 88 bpm    Resting BP 126/68 102/60    Resting Oxygen Saturation  99 % 98 %    Exercise Oxygen Saturation  during 6 min walk 98 % 96 %    Max Ex. HR 94 bpm 95 bpm    Max Ex. BP 144/82 110/62    2 Minute Post BP 124/72 --             Oxygen  Initial Assessment:   Oxygen Re-Evaluation:   Oxygen Discharge (Final Oxygen Re-Evaluation):   Initial Exercise Prescription:  Initial Exercise Prescription - 11/28/22 1300       Date of Initial Exercise RX and Referring Provider   Date 11/28/22    Referring Provider Dr. Dorothyann Peng, MD      Oxygen   Maintain Oxygen Saturation 88% or higher      Treadmill   MPH 2.4    Grade 1    Minutes 15    METs 3.17      Recumbant Bike   Level 3    RPM 50    Watts 54    Minutes 15    METs 3.97      Recumbant Elliptical   Level 1.5    RPM 50    Minutes 15    METs 3.97  REL-XR   Level 3    Speed 50    Minutes 15    METs 3.97      Prescription Details   Frequency (times per week) 3    Duration Progress to 30 minutes of continuous aerobic without signs/symptoms of physical distress      Intensity   THRR 40-80% of Max Heartrate 107-146    Ratings of Perceived Exertion 11-13    Perceived Dyspnea 0-4      Progression   Progression Continue to progress workloads to maintain intensity without signs/symptoms of physical distress.      Resistance Training   Training Prescription Yes    Weight 3 lb    Reps 10-15             Perform Capillary Blood Glucose checks as needed.  Exercise Prescription Changes:   Exercise Prescription Changes     Row Name 11/28/22 1300 12/10/22 1600 12/25/22 1600 12/30/22 0800 01/09/23 1200     Response to Exercise   Blood Pressure (Admit) 126/68 104/62 122/62 -- 140/80   Blood Pressure (Exercise) 144/82 118/60 140/60 -- --   Blood Pressure (Exit) 124/72 110/62 134/60 -- 120/72   Heart Rate (Admit) 68 bpm 84 bpm 88 bpm -- 78 bpm   Heart Rate (Exercise) 94 bpm 140 bpm 137 bpm -- 132 bpm   Heart Rate (Exit) 69 bpm 99 bpm 106 bpm -- 91 bpm   Oxygen Saturation (Admit) 99 % -- -- -- --   Oxygen Saturation (Exercise) 98 % -- -- -- --   Rating of Perceived Exertion (Exercise) -- 15   Perceived Dyspnea (Exercise) 0 -- --  -- --   Symptoms None none none -- none   Comments Results 2nd full week of exercise -- -- --   Duration -- Continue with 30 min of aerobic exercise without signs/symptoms of physical distress. Continue with 30 min of aerobic exercise without signs/symptoms of physical distress. -- Continue with 30 min of aerobic exercise without signs/symptoms of physical distress.   Intensity -- THRR unchanged THRR unchanged -- THRR unchanged     Progression   Progression -- Continue to progress workloads to maintain intensity without signs/symptoms of physical distress. Continue to progress workloads to maintain intensity without signs/symptoms of physical distress. -- Continue to progress workloads to maintain intensity without signs/symptoms of physical distress.   Average METs -- 3.49 4.23 -- 5.44     Resistance Training   Training Prescription -- Yes Yes -- Yes   Weight -- 3 lb 3 lb -- 4 lb   Reps -- 10-15 10-15 -- 10-15     Interval Training   Interval Training -- No No -- No     Treadmill   MPH -- 2.6 3 -- 3   Grade -- 2.5 3 -- 6   Minutes -- 15 15 -- 15   METs -- 3.89 4.54 -- 5.78     Recumbant Bike   Level -- 3 6 -- --   Watts -- 27 50 -- --   Minutes -- 15 15 -- --   METs -- 2.97 3.81 -- --     NuStep   Level -- -- -- -- 6   Minutes -- -- -- -- 15   METs -- -- -- -- 5.5     Recumbant Elliptical   Level -- 3 -- -- 5   Minutes -- 15 -- -- 15   METs -- 2.5 -- --  1.8     REL-XR   Level -- 4 10 -- 12   Minutes -- 15 15 -- 15   METs -- 5 6.3 -- 8     T5 Nustep   Level -- -- 3 -- --   Minutes -- -- 15 -- --   METs -- -- 2.3 -- --     Home Exercise Plan   Plans to continue exercise at -- -- -- [facility]   Frequency -- -- -- Add 2 additional days to program exercise sessions. Add 2 additional days to program exercise sessions.   Initial Home Exercises Provided -- -- -- 12/30/22 12/30/22     Oxygen   Maintain Oxygen Saturation -- 88% or  higher 88% or higher -- 88% or higher    Row Name 01/23/23 1300             Response to Exercise   Blood Pressure (Admit) 118/68       Blood Pressure (Exit) 126/64       Heart Rate (Admit) 70 bpm       Heart Rate (Exercise) 120 bpm       Heart Rate (Exit) 85 bpm       Rating of Perceived Exertion (Exercise) 15       Symptoms none       Duration Continue with 30 min of aerobic exercise without signs/symptoms of physical distress.       Intensity THRR unchanged         Progression   Progression Continue to progress workloads to maintain intensity without signs/symptoms of physical distress.       Average METs 5.9         Resistance Training   Training Prescription Yes       Weight 4 lb       Reps 10-15         Interval Training   Interval Training No         Treadmill   MPH 3.6       Grade 5       Minutes 15       METs 6.24         Recumbant Bike   Level 8       Watts 50       Minutes 15       METs 3.79         Recumbant Elliptical   Level 7       Minutes 15       METs 5.9         REL-XR   Level 10       Minutes 15       METs 7.8         Home Exercise Plan   Plans to continue exercise at [facility]       Frequency Add 2 additional days to program exercise sessions.       Initial Home Exercises Provided 12/30/22         Oxygen   Maintain Oxygen Saturation 88% or higher                Exercise Comments:   Exercise Comments     Row Name 12/02/22 0820           Exercise Comments First full day of exercise!  Patient was oriented to gym and equipment including functions, settings, policies, and procedures.  Patient's individual exercise prescription  and treatment plan were reviewed.  All starting workloads were established based on the results of the 6 minute walk test done at initial orientation visit.  The plan for exercise progression was also introduced and progression will be customized based on patient's performance and goals.                 Exercise Goals and Review:   Exercise Goals     Row Name 11/28/22 1239             Exercise Goals   Increase Physical Activity Yes       Intervention Provide advice, education, support and counseling about physical activity/exercise needs.;Develop an individualized exercise prescription for aerobic and resistive training based on initial evaluation findings, risk stratification, comorbidities and participant's personal goals.       Expected Outcomes Short Term: Attend rehab on a regular basis to increase amount of physical activity.;Long Term: Exercising regularly at least 3-5 days a week.;Long Term: Add in home exercise to make exercise part of routine and to increase amount of physical activity.       Increase Strength and Stamina Yes       Intervention Develop an individualized exercise prescription for aerobic and resistive training based on initial evaluation findings, risk stratification, comorbidities and participant's personal goals.;Provide advice, education, support and counseling about physical activity/exercise needs.       Expected Outcomes Short Term: Increase workloads from initial exercise prescription for resistance, speed, and METs.;Short Term: Perform resistance training exercises routinely during rehab and add in resistance training at home;Long Term: Improve cardiorespiratory fitness, muscular endurance and strength as measured by increased METs and functional capacity ( )       Able to understand and use rate of perceived exertion (RPE) scale Yes       Intervention Provide education and explanation on how to use RPE scale       Expected Outcomes Short Term: Able to use RPE daily in rehab to express subjective intensity level;Long Term:  Able to use RPE to guide intensity level when exercising independently       Able to understand and use Dyspnea scale Yes       Intervention Provide education and explanation on how to use Dyspnea scale       Expected  Outcomes Short Term: Able to use Dyspnea scale daily in rehab to express subjective sense of shortness of breath during exertion;Long Term: Able to use Dyspnea scale to guide intensity level when exercising independently       Knowledge and understanding of Target Heart Rate Range (THRR) Yes       Intervention Provide education and explanation of THRR including how the numbers were predicted and where they are located for reference       Expected Outcomes Short Term: Able to state/look up THRR;Short Term: Able to use daily as guideline for intensity in rehab;Long Term: Able to use THRR to govern intensity when exercising independently       Able to check pulse independently Yes       Intervention Provide education and demonstration on how to check pulse in carotid and radial arteries.;Review the importance of being able to check your own pulse for safety during independent exercise       Expected Outcomes Short Term: Able to explain why pulse checking is important during independent exercise;Long Term: Able to check pulse independently and accurately       Understanding of Exercise Prescription Yes  Intervention Provide education, explanation, and written materials on patient's individual exercise prescription       Expected Outcomes Short Term: Able to explain program exercise prescription;Long Term: Able to explain home exercise prescription to exercise independently                Exercise Goals Re-Evaluation :  Exercise Goals Re-Evaluation     Row Name 12/02/22 0820 12/09/22 1518 12/10/22 1603 12/25/22 1605 12/30/22 0801     Exercise Goal Re-Evaluation   Exercise Goals Review Able to understand and use rate of perceived exertion (RPE) scale;Able to understand and use Dyspnea scale;Knowledge and understanding of Target Heart Rate Range (THRR);Understanding of Exercise Prescription Able to understand and use rate of perceived exertion (RPE) scale;Able to understand and use Dyspnea  scale;Knowledge and understanding of Target Heart Rate Range (THRR);Understanding of Exercise Prescription Increase Physical Activity;Increase Strength and Stamina;Understanding of Exercise Prescription Increase Physical Activity;Increase Strength and Stamina;Understanding of Exercise Prescription Increase Physical Activity;Increase Strength and Stamina;Understanding of Exercise Prescription;Able to understand and use rate of perceived exertion (RPE) scale;Able to understand and use Dyspnea scale;Knowledge and understanding of Target Heart Rate Range (THRR);Able to check pulse independently   Comments Reviewed RPE scale, THR and program prescription with pt today.  Pt voiced understanding and was given a copy of goals to take home. Gabbrielle reports being active at home caring for her grandchild and mother, but is not as active as she was pre-COVID. She reports that she would like to start exercising outside of rehab, reviewed general recommendations for aerobic exercise 150 minutes/week (~30 minutes for 5 days/week) - she would like to start walking outside which she feels would help her physical and mental health. She has not reviewed home exercise with EP yet as she began rehab last week. Darcella is doing well in rehab for her first couple of weeks she has been here. She has followed her initial exercise prescription well and was even able to increase to level 4 on the XR and up to a 2% incline on the treadmill. She has been reaching her THR and reporting appropriate RPEs. We will continue to monitor as she progresses in the program. Nalanie continues to do well in the program. She recently increased her overall average MET level to 4.23 METs. She also increased her treadmill workload to a speed of 3 mph with an incline of 3%. She improved to level 10 on the XR and level 6 on the recumbent bike as well. She has continued to do well with 3 lb hand weights for resistance training also. We will continue to monitor her  progress in the program. Reviewed home exercise with pt today.  Pt plans to walk and has joined the [facility] for exercise.  Reviewed THR, pulse, RPE, sign and symptoms, pulse oximetery and when to call 911 or MD.  Also discussed weather considerations and indoor options.  Pt voiced understanding.   Expected Outcomes Short: Use RPE daily to regulate intensity. Long: Follow program prescription in THR. ST: walk outside tues/Thurs when not at rehab, EP to go over home exercise. LT: Complete aerobic exercise 150 minutes/week (~30 minutes for 5 days/week) Short: Continue to exercise at initial exercise prescription Long: Increase overall MET level and stamina Short: Try 4 lb hand weights for resistance training. Long: Continue to improve strength and stamina. Short: Start going to [place] on alternate days Long: Continue to exercise independently    Row Name 01/09/23 1204 01/23/23 1400  Exercise Goal Re-Evaluation   Exercise Goals Review Increase Physical Activity;Increase Strength and Stamina;Understanding of Exercise Prescription Increase Physical Activity;Increase Strength and Stamina;Understanding of Exercise Prescription      Comments Quetzal continues to do well in rehab. She has increased her overall MET level to 5! She also increased her incline on the treadmill to 6% and up to level 12 on the XR.She continues to reach her THR. We will continue to monitor. Dorothye is doing well in rehab and is close to graduating. She completed her post and improved by 120 ft! She also increased her treadmill workload to a speed of 3.6 mph and an incline of 5%. She improved to level 8 on the recumbent bike and level 7 on the recumbent elliptical as well. We will continue to monitor her progress until she graduates from the program.      Expected Outcomes Short: Continue to increase T4 Nustep level Long: Continue to increase overall MET level and stamina Short: Graduate. Long: Continue to exercise  independently.               Discharge Exercise Prescription (Final Exercise Prescription Changes):  Exercise Prescription Changes - 01/23/23 1300       Response to Exercise   Blood Pressure (Admit) 118/68    Blood Pressure (Exit) 126/64    Heart Rate (Admit) 70 bpm    Heart Rate (Exercise) 120 bpm    Heart Rate (Exit) 85 bpm    Rating of Perceived Exertion (Exercise) 15    Symptoms none    Duration Continue with 30 min of aerobic exercise without signs/symptoms of physical distress.    Intensity THRR unchanged      Progression   Progression Continue to progress workloads to maintain intensity without signs/symptoms of physical distress.    Average METs 5.9      Resistance Training   Training Prescription Yes    Weight 4 lb    Reps 10-15      Interval Training   Interval Training No      Treadmill   MPH 3.6    Grade 5    Minutes 15    METs 6.24      Recumbant Bike   Level 8    Watts 50    Minutes 15    METs 3.79      Recumbant Elliptical   Level 7    Minutes 15    METs 5.9      REL-XR   Level 10    Minutes 15    METs 7.8      Home Exercise Plan   Plans to continue exercise at [facility]    Frequency Add 2 additional days to program exercise sessions.    Initial Home Exercises Provided 12/30/22      Oxygen   Maintain Oxygen Saturation 88% or higher             Nutrition:  Target Goals: Understanding of nutrition guidelines, daily intake of sodium 1500mg , cholesterol 200mg , calories 30% from fat and 7% or less from saturated fats, daily to have 5 or more servings of fruits and vegetables.  Education: All About Nutrition: -Group instruction provided by verbal, written material, interactive activities, discussions, models, and posters to present general guidelines for heart healthy nutrition including fat, fiber, MyPlate, the role of sodium in heart healthy nutrition, utilization of the nutrition label, and utilization of this  knowledge for meal planning. Follow up email sent as well. Written  material given at graduation. Flowsheet Row Cardiac Rehab from 01/22/2023 in [facility] Cardiac and Pulmonary Rehab  Education need identified 11/28/22  Date 01/22/23  [Part 2]  Educator [facility]  Instruction Review Code 1- Verbalizes Understanding       Biometrics:  Pre Biometrics - 11/28/22 1304       Pre Biometrics   Height 5' 8.5" (1.74 m)    Weight 190 lb 8 oz (86.4 kg)    Waist Circumference 37 inches    Hip Circumference 42 inches    Waist to Hip Ratio 0.88 %    BMI (Calculated) 28.54    Single Leg Stand 16.7 seconds             Post Biometrics - 01/20/23 1610        Post  Biometrics   Height 5' 8.5" (1.74 m)    Weight 190 lb 9.6 oz (86.5 kg)    Waist Circumference 36 inches    Hip Circumference 43 inches    Waist to Hip Ratio 0.84 %    BMI (Calculated) 28.56    Single Leg Stand 30 seconds             Nutrition Therapy Plan and Nutrition Goals:  Nutrition Therapy & Goals - 12/09/22 1333       Nutrition Therapy   Diet Heart healthy, low Na    Drug/Food Interactions Statins/Certain Fruits    Protein (specify units) 95g    Fiber 30 grams    Whole Grain Foods 3 servings    Saturated Fats 16 max. grams    Fruits and Vegetables 8 servings/day    Sodium 2 grams      Personal Nutrition Goals   Nutrition Goal ST: make wrap/sandwich at home with whole wheat bread/wraps for lunch, add black beans to rice, add protein, fiber, and fat to snacks such as peanut butter to rice cakes with fruit, swap butter for avocado oil. LT: follow MyPlate guidelines, limit eating out <1-2x/week, include at least 30g of fiber/day, limit saturated fat <16g/day    Comments Initial Assessment: 57 y.o. F admitted to cardiac rehab s/p NSTEMI with stent. PMHx includes CAD, HLD, HTN, anxiety, tobacco use. Reviewed relevant medications: alprazolam (xanax), citalopram (celexa), rosuvastatin (crestor). Reviewed most recent labwork.  Anthropometrics: Ht: 5'8.5" Wt: 86.4 kg BMI: 28.54. PYP Score: 34. Lifestyle: Cadince reports that she used to be more active before COVID, but still stays busy caring for her grandchild and mother. She would like to begin structured exercise outside of rehab as well. Food recall:Meal 1: nabs or rice cakes when going to rehab. Boiled egg and scrambled egg at home, but will have egg and cheese out with no bread. Meal 2: Goes out to get a sub that she usually splits to have for dinner as well (tomato, [place], cheese, cucumber, banana pepper, pickles, onions, and mayo on flatbread) Meal 3: baked chicken, squash, broccoli, cauliflower Snack 1-3: she will have snacks with her grandson such as fruit snacks or sugar-free popciles, but for herself she will buy peanuts, trailmix. Drinks: red bull every once in a while, water and sometimes diet soda - she has cut down on redbull and soda; she reports thats she used to drink it often during the day. Common foods/practices: Added fat: she adds butter to her food most of the time which she knows she should limit. She does not like the flavor of olive oil when she tried it previously. Added salt: she does not salt her food  aside from some seasoning blends. Eating out: most days. Red meat: <1-2x/week. Comments: Ksenia reports she used to always bake her foods instead of frying and eating vegetables, but after getting home from the hospital she had changes in taste that has been improving - she still eats baked chicken and rice, but eats out often and feels that she has been eating more snack items. Morayma would like to cut down on butter and cook more at home. Education: Provided Heart healthy education; reviewing MyPlate, types of fat, sodium, added sugar, nutrition label. Provided meal planning/shopping education; reviewing how to build flavor and easy meals/swaps. Suggested she add protein, fiber, and some fat to most meals and snacks; examples includes peanut butter with fruit to  ricecake, add black beans to rice, swap butter for avocado oil (in moderation - can use reusable spray bottle), and make whole wheat wraps or sandwiches at home for lunch at least 3-4x/week with lots of cooked and/or raw vegetables and lean protein such as leftover baked chicken.      Intervention Plan   Intervention Prescribe, educate and counsel regarding individualized specific dietary modifications aiming towards targeted core components such as weight, hypertension, lipid management, diabetes, heart failure and other comorbidities.;Nutrition handout(s) given to patient.    Expected Outcomes Short Term Goal: Understand basic principles of dietary content, such as calories, fat, sodium, cholesterol and nutrients.;Short Term Goal: A plan has been developed with personal nutrition goals set during dietitian appointment.;Long Term Goal: Adherence to prescribed nutrition plan.             Nutrition Assessments:  MEDIFICTS Score Key: ?70 Need to make dietary changes  40-70 Heart Healthy Diet ? 40 Therapeutic Level Cholesterol Diet  Flowsheet Row Cardiac Rehab from 01/22/2023 in [facility] Cardiac and Pulmonary Rehab  Picture Your Plate Total Score on Discharge 47      Picture Your Plate Scores: <16 Unhealthy dietary pattern with much room for improvement. 41-50 Dietary pattern unlikely to meet recommendations for good health and room for improvement. 51-60 More healthful dietary pattern, with some room for improvement.  >60 Healthy dietary pattern, although there may be some specific behaviors that could be improved.    Nutrition Goals Re-Evaluation:  Nutrition Goals Re-Evaluation     Row Name 01/08/23 0752             Goals   Current Weight 192 lb (87.1 kg)       Nutrition Goal lose more weight       Comment Pheobe has been eating more wheat bread and making healthier choices. She is making more baked foods and eating more yogurt. She has been eating more fruit like oranges and  blueberries.       Expected Outcome Short: lose a few pounds. Long: reach her weight goal.                Nutrition Goals Discharge (Final Nutrition Goals Re-Evaluation):  Nutrition Goals Re-Evaluation - 01/08/23 0752       Goals   Current Weight 192 lb (87.1 kg)    Nutrition Goal lose more weight    Comment Lavera has been eating more wheat bread and making healthier choices. She is making more baked foods and eating more yogurt. She has been eating more fruit like oranges and blueberries.    Expected Outcome Short: lose a few pounds. Long: reach her weight goal.             Psychosocial: Target Goals: Acknowledge presence  or absence of significant depression and/or stress, maximize coping skills, provide positive support system. Participant is able to verbalize types and ability to use techniques and skills needed for reducing stress and depression.   Education: Stress, Anxiety, and Depression - Group verbal and visual presentation to define topics covered.  Reviews how body is impacted by stress, anxiety, and depression.  Also discusses healthy ways to reduce stress and to treat/manage anxiety and depression.  Written material given at graduation. Flowsheet Row Cardiac Rehab from 01/22/2023 in [facility] Cardiac and Pulmonary Rehab  Date 12/18/22  Educator [facility]  Instruction Review Code 1- [employer] Understanding       Education: Sleep Hygiene -Provides group verbal and written instruction about how sleep can affect your health.  Define sleep hygiene, discuss sleep cycles and impact of sleep habits. Review good sleep hygiene tips.    Initial Review & Psychosocial Screening:  Initial Psych Review & Screening - 11/27/22 1002       Initial Review   Current issues with Current Anxiety/Panic;Current Depression;Current Stress Concerns    Comments Health concerns, replaying MI, noticing increased anxiety and not feeling like it can be controlled at times      Family Dynamics    Good Support System? Yes      Barriers   Psychosocial barriers to participate in program The patient should benefit from training in stress management and relaxation.      Screening Interventions   Interventions Encouraged to exercise;Provide feedback about the scores to participant;To provide support and resources with identified psychosocial needs    Expected Outcomes Short Term goal: Utilizing psychosocial counselor, staff and physician to assist with identification of specific Stressors or current issues interfering with healing process. Setting desired goal for each stressor or current issue identified.;Long Term Goal: Stressors or current issues are controlled or eliminated.;Short Term goal: Identification and review with participant of any Quality of Life or Depression concerns found by scoring the questionnaire.;Long Term goal: The participant improves quality of Life and PHQ9 Scores as seen by post scores and/or verbalization of changes             Quality of Life Scores:   Quality of Life - 01/22/23 0841       Quality of Life Scores   Health/Function Pre 9.4 %    Health/Function Post 13.63 %    Health/Function % Change 45 %    Socioeconomic Pre 13.13 %    Socioeconomic Post 14.43 %    Socioeconomic % Change  9.9 %    Psych/Spiritual Pre 22.29 %    Psych/Spiritual Post 16.29 %    Psych/Spiritual % Change -26.92 %    Family Pre 19.8 %    Family Post 16.9 %    Family % Change -14.65 %    GLOBAL Pre 14.31 %    GLOBAL Post 14.82 %    GLOBAL % Change 3.56 %            Scores of 19 and below usually indicate a poorer quality of life in these areas.  A difference of  2-3 points is a clinically meaningful difference.  A difference of 2-3 points in the total score of the Quality of Life Index has been associated with significant improvement in overall quality of life, self-image, physical symptoms, and general health in studies assessing change in quality of  life.  PHQ-9: Review Flowsheet       01/22/2023 12/27/2022 11/28/2022  Depression screen PHQ 2/9  Decreased Interest  3 2 3   Down, Depressed, Hopeless 3 2 3   PHQ - 2 Score 6 4 6   Altered sleeping 2 0 2  Tired, decreased energy 1 1 3   Change in appetite 1 2 3   Feeling bad or failure about yourself  2 3 3   Trouble concentrating 3 2 3   Moving slowly or fidgety/restless 2 2 3   Suicidal thoughts 0 0 0  PHQ-9 Score 17 14 23   Difficult doing work/chores Very difficult Extremely dIfficult Extremely dIfficult   Interpretation of Total Score  Total Score Depression Severity:  1-4 = Minimal depression, 5-9 = Mild depression, 10-14 = Moderate depression, 15-19 = Moderately severe depression, 20-27 = Severe depression   Psychosocial Evaluation and Intervention:  Psychosocial Evaluation - 11/27/22 1021       Psychosocial Evaluation & Interventions   Interventions Encouraged to exercise with the program and follow exercise prescription;Stress management education;Relaxation education    Comments 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 She reports the mental load has been more than she thought it would be. When its quiet, she finds herself replaying the her MI events and the "what ifs." She retired from [employer] in 2021 to help care for her grandson. She did go back part time with [employer] but does not know if she wants to return because the mental stress of it all. She is interested in [employer] and hopes maybe she can find something part time from home. She quit smoking the day of her heart attack because she is scared to start back. She wants to focus on her mental health and is very ready to start the program to work on caring for herself.    Expected Outcomes Short: attend cardiac rehab for education and exercise. Long: develop and maintain positive self care habits.    Continue Psychosocial Services  Follow up required by staff              Psychosocial Re-Evaluation:  Psychosocial Re-Evaluation     Row Name 12/11/22 0815 12/27/22 0723 01/08/23 0809         Psychosocial Re-Evaluation   Current issues with Current Stress Concerns;Current Anxiety/Panic Current Stress Concerns;Current Anxiety/Panic;Current Depression;Current Psychotropic Meds Current Stress Concerns;Current Anxiety/Panic;Current Depression;Current Psychotropic Meds     Comments Analina states that she has had a lot of stress related to family concerns. She has been taking care of her mother who has her own health needs. She is also stressed about not being able to work currently. She has also been overwhelmed with taking medications and her different appointments. She states that she does not have a healthy way to relieve stress but is looking for new ways to relax. She does enjoy putting her headphones in and listening to music and she also states that exercise has been a good stress reliever. Reviewed patient health questionnaire (PHQ-9) with patient for follow up. Previously, patients score indicated signs/symptoms of depression.  Reviewed to see if patient is improving symptom wise while in program.  Score improved and patient states that it is because they have been moving more.  She is still taking her meds but is finding herself easily distracted.  She has a follow up with her PCP next week and was encouraged to talk to her about her depression symptoms to see about possibly increasing meds or changing them some to help.  She still just doesn't feel like herelf yet.  She was worried about it, but we reassured  her that she is improving and well within normal feelings.  We also talked to her about using a pill box to help rememeber meds and to set them up on a schedule that will work best for her.  She attended stress management class last week too. Brighid is doing well in the program. She states that sometimes she feels anxiety and gets upset with herself that she had a  heart attack. Informed her that she is making lifestyle changes for the better and that she can still do alot more living now that she is on track with her health. Her doctor wants her to talk to a therapist and she may do just that.     Expected Outcomes Short: Continue to look for healthy avenues of stress relief. Long: Continue to attend rehab for stress relief. Short: Talk to doctor about depression symptoms Long: Find routine that works for her for her medications Short: look into talking to a therapist. Long: maintain a healthy state of mind.     Interventions Encouraged to attend Cardiac Rehabilitation for the exercise Encouraged to attend Cardiac Rehabilitation for the exercise Encouraged to attend Cardiac Rehabilitation for the exercise     Continue Psychosocial Services  Follow up required by staff Follow up required by staff Follow up required by staff       Initial Review   Source of Stress Concerns Family;Chronic Illness -- --     Comments Health concerns, replaying MI, noticing increased anxiety and not feeling like it can be controlled at times -- --              Psychosocial Discharge (Final Psychosocial Re-Evaluation):  Psychosocial Re-Evaluation - 01/08/23 0809       Psychosocial Re-Evaluation   Current issues with Current Stress Concerns;Current Anxiety/Panic;Current Depression;Current Psychotropic Meds    Comments Rose-Marie is doing well in the program. She states that sometimes she feels anxiety and gets upset with herself that she had a heart attack. Informed her that she is making lifestyle changes for the better and that she can still do alot more living now that she is on track with her health. Her doctor wants her to talk to a therapist and she may do just that.    Expected Outcomes Short: look into talking to a therapist. Long: maintain a healthy state of mind.    Interventions Encouraged to attend Cardiac Rehabilitation for the exercise    Continue Psychosocial  Services  Follow up required by staff             Vocational Rehabilitation: Provide vocational rehab assistance to qualifying candidates.   Vocational Rehab Evaluation & Intervention:  Vocational Rehab - 12/27/22 0735       Vocational Rehab Re-[facility]vaulation   Comments 12/27/22 Shaima met with vocational rehab.  She told them that she only knows [employer] and retired from there but had gone back to work to supplement her income.  She told voc rehab that she was not mentally ready to return to work yet and was worried about retraining to learn something new.  We encouraged her to continue to work with vocational rehab if she does want to work to find something that will work for her.  She will keep [place] posted with updates.             Education: Education Goals: Education classes will be provided on a variety of topics geared toward better understanding of heart health and risk factor modification. Participant will  state understanding/return demonstration of topics presented as noted by education test scores.  Learning Barriers/Preferences:  Learning Barriers/Preferences - 11/27/22 0957       Learning Barriers/Preferences   Learning Barriers None    Learning Preferences None             General Cardiac Education Topics:  AED/CPR: - Group verbal and written instruction with the use of models to demonstrate the basic use of the AED with the basic ABC's of resuscitation.   Anatomy and Cardiac Procedures: - Group verbal and visual presentation and models provide information about basic cardiac anatomy and function. Reviews the testing methods done to diagnose heart disease and the outcomes of the test results. Describes the treatment choices: Medical Management, Angioplasty, or Coronary Bypass Surgery for treating various heart conditions including Myocardial Infarction, Angina, Valve Disease, and Cardiac Arrhythmias.  Written material given at graduation. Flowsheet Row Cardiac  Rehab from 01/22/2023 in [facility] Cardiac and Pulmonary Rehab  Education need identified 11/28/22       Medication Safety: - Group verbal and visual instruction to review commonly prescribed medications for heart and lung disease. Reviews the medication, class of the drug, and side effects. Includes the steps to properly store meds and maintain the prescription regimen.  Written material given at graduation.   Intimacy: - Group verbal instruction through game format to discuss how heart and lung disease can affect sexual intimacy. Written material given at graduation.. Flowsheet Row Cardiac Rehab from 01/22/2023 in [facility] Cardiac and Pulmonary Rehab  Date 01/01/23  Educator [facility]  Instruction Review Code 1- Verbalizes Understanding       Know Your Numbers and Heart Failure: - Group verbal and visual instruction to discuss disease risk factors for cardiac and pulmonary disease and treatment options.  Reviews associated critical values for Overweight/Obesity, Hypertension, Cholesterol, and Diabetes.  Discusses basics of heart failure: signs/symptoms and treatments.  Introduces Heart Failure Zone chart for action plan for heart failure.  Written material given at graduation. Flowsheet Row Cardiac Rehab from 01/22/2023 in [facility] Cardiac and Pulmonary Rehab  Date 12/04/22  Educator [facility]  Instruction Review Code 1- Verbalizes Understanding       Infection Prevention: - Provides verbal and written material to individual with discussion of infection control including proper hand washing and proper equipment cleaning during exercise session. Flowsheet Row Cardiac Rehab from 01/22/2023 in [facility] Cardiac and Pulmonary Rehab  Date 11/28/22  Educator NT  Instruction Review Code 1- Verbalizes Understanding       Falls Prevention: - Provides verbal and written material to individual with discussion of falls prevention and safety. Flowsheet Row Cardiac Rehab from 01/22/2023 in [facility] Cardiac and Pulmonary Rehab   Date 11/28/22  Educator NT  Instruction Review Code 1- Verbalizes Understanding       Other: -Provides group and verbal instruction on various topics (see comments)   Knowledge Questionnaire Score:  Knowledge Questionnaire Score - 01/22/23 0840       Knowledge Questionnaire Score   Pre Score 21/26    Post Score 24/26             Core Components/Risk Factors/Patient Goals at Admission:  Personal Goals and Risk Factors at Admission - 11/27/22 0954       Core Components/Risk Factors/Patient Goals on Admission    Weight Management Yes;Weight Loss    Intervention Weight Management: Develop a combined nutrition and exercise program designed to reach desired caloric intake, while maintaining appropriate intake of nutrient and fiber, sodium and fats,  and appropriate energy expenditure required for the weight goal.;Weight Management: Provide education and appropriate resources to help participant work on and attain dietary goals.;Weight Management/Obesity: Establish reasonable short term and long term weight goals.    Expected Outcomes Short Term: Continue to assess and modify interventions until short term weight is achieved;Long Term: Adherence to nutrition and physical activity/exercise program aimed toward attainment of established weight goal;Weight Loss: Understanding of general recommendations for a balanced deficit meal plan, which promotes 1-2 lb weight loss per week and includes a negative energy balance of [phone] kcal/d;Understanding recommendations for meals to include 15-35% energy as protein, 25-35% energy from fat, 35-60% energy from carbohydrates, less than 200mg  of dietary cholesterol, 20-35 gm of total fiber daily;Understanding of distribution of calorie intake throughout the day with the consumption of 4-5 meals/snacks    Tobacco Cessation Yes   Quit 09/22/22   Intervention Assist the participant in steps to quit. Provide individualized education and counseling about  committing to Tobacco Cessation, relapse prevention, and pharmacological support that can be provided by physician.;Education officer, environmental, assist with locating and accessing local/national Quit Smoking programs, and support quit date choice.    Expected Outcomes Short Term: Will demonstrate readiness to quit, by selecting a quit date.;Short Term: Will quit all tobacco product use, adhering to prevention of relapse plan.;Long Term: Complete abstinence from all tobacco products for at least 12 months from quit date.    Hypertension Yes    Intervention Provide education on lifestyle modifcations including regular physical activity/exercise, weight management, moderate sodium restriction and increased consumption of fresh fruit, vegetables, and low fat dairy, alcohol moderation, and smoking cessation.;Monitor prescription use compliance.    Expected Outcomes Short Term: Continued assessment and intervention until BP is < 140/70mm HG in hypertensive participants. < 130/90mm HG in hypertensive participants with diabetes, heart failure or chronic kidney disease.;Long Term: Maintenance of blood pressure at goal levels.    Lipids Yes    Intervention Provide education and support for participant on nutrition & aerobic/resistive exercise along with prescribed medications to achieve LDL 70mg , HDL >40mg .    Expected Outcomes Short Term: Participant states understanding of desired cholesterol values and is compliant with medications prescribed. Participant is following exercise prescription and nutrition guidelines.;Long Term: Cholesterol controlled with medications as prescribed, with individualized exercise RX and with personalized nutrition plan. Value goals: LDL < 70mg , HDL > 40 mg.             Education:Diabetes - Individual verbal and written instruction to review signs/symptoms of diabetes, desired ranges of glucose level fasting, after meals and with exercise. Acknowledge that pre and post  exercise glucose checks will be done for 3 sessions at entry of program.   Core Components/Risk Factors/Patient Goals Review:   Goals and Risk Factor Review     Row Name 12/11/22 0805 01/08/23 0754           Core Components/Risk Factors/Patient Goals Review   Personal Goals Review Weight Management/Obesity;Tobacco Cessation;Hypertension Weight Management/Obesity;Tobacco Cessation;Hypertension      Review Oralee is doing well in rehab. She states that she would like to lose some weight. She weighed 191.1 lbs today, but states that she has a goal weight of 175 lb. Tawney states that she has not been checking her BP at home, and she does not own a BP cuff. We encouraged her to purchase a BP cuff and begin to monitor her blood pressure. Caitlinn states that she has continued to not smoke, and has not smoked since  having her stent put in. Cidney is still tobacco free and plans to stay that way. She stopped smoking in December after her heart attack. Jaymie plans to stay done and not go back to using tobacco products.      Expected Outcomes Short: Begin to monitor BP at home. Long: Continue to monitor lifestyle risk factors. Short: continue to be tobacco free. Long: be six months of no smoking.               Core Components/Risk Factors/Patient Goals at Discharge (Final Review):   Goals and Risk Factor Review - 01/08/23 0754       Core Components/Risk Factors/Patient Goals Review   Personal Goals Review Weight Management/Obesity;Tobacco Cessation;Hypertension    Review Tanner is still tobacco free and plans to stay that way. She stopped smoking in December after her heart attack. Mariaceleste plans to stay done and not go back to using tobacco products.    Expected Outcomes Short: continue to be tobacco free. Long: be six months of no smoking.             ITP Comments:  ITP Comments     Row Name 11/27/22 0954 11/28/22 1234 12/02/22 0820 12/09/22 1501 12/25/22 0926   ITP Comments Initial phone  call completed. Diagnosis can be found in [facility] 12/17. EP Orientation scheduled for Thursday 2/22 at 9:30. Completed and gym orientation. Initial ITP created and sent for review to Dr. Bethann Punches, Medical Director. First full day of exercise!  Patient was oriented to gym and equipment including functions, settings, policies, and procedures.  Patient's individual exercise prescription and treatment plan were reviewed.  All starting workloads were established based on the results of the 6 minute walk test done at initial orientation visit.  The plan for exercise progression was also introduced and progression will be customized based on patient's performance and goals. Completed intitial RD consultation 30 Day review completed. Medical Director ITP review done, changes made as directed, and signed approval by Medical Director. new to program    Row Name 01/22/23 1348 01/29/23 0834         ITP Comments 30 day review completed. ITP sent to Dr. Bethann Punches, Medical Director of Cardiac Rehab. Continue with ITP unless changes are made by physician. Lamaria graduated today from  rehab with 36 sessions completed.  Details of the patient's exercise prescription and what She needs to do in order to continue the prescription and progress were discussed with patient.  Patient was given a copy of prescription and goals.  Patient verbalized understanding.  Kyliah plans to continue to exercise by [facility] fitness center.               Comments: Discharge ITP

## 2023-01-29 NOTE — Progress Notes (Signed)
Daily Session Note  Patient Details  Name: Jeanette Ball MRN: [id number] Date of Birth: December 03, 1965 Referring Provider:   Flowsheet Row Cardiac Rehab from 11/28/2022 in [facility]  Referring Provider Dr. Dorothyann Peng, MD       Encounter Date: 01/29/2023  Check In:  Session Check In - 01/29/23 0830       Check-In   Supervising physician immediately available to respond to emergencies See telemetry face sheet for immediately available ER MD    Location [facility]-Cardiac & Pulmonary Rehab    Staff Present Lanny Hurst, RN, ADN;Joseph Reino Kent, RCP,RRT,BSRT;Noah Tickle, BS, Exercise Physiologist    Virtual Visit No    Medication changes reported     No    Fall or balance concerns reported    No    Warm-up and Cool-down Performed on first and last piece of equipment    Resistance Training Performed Yes    VAD Patient? No    PAD/SET Patient? No      Pain Assessment   Currently in Pain? No/denies                Social History   Tobacco Use  Smoking Status Former   Packs/day: 1.00   Years: 7.00   Additional pack years: 0.00   Total pack years: 7.00   Types: Cigarettes   Start date: 09/22/2022  Smokeless Tobacco Never    Goals Met:  Independence with exercise equipment Exercise tolerated well No report of concerns or symptoms today Strength training completed today  Goals Unmet:  Not Applicable  Comments:  Phillippa graduated today from  rehab with 36 sessions completed.  Details of the patient's exercise prescription and what She needs to do in order to continue the prescription and progress were discussed with patient.  Patient was given a copy of prescription and goals.  Patient verbalized understanding.  Tavon plans to continue to exercise by [facility].    Dr. Bethann Punches is Medical Director for [facility] Cardiac Rehabilitation.  Dr. Vida Rigger is Medical Director for [facility] Pulmonary Rehabilitation.

## 2023-10-21 IMAGING — CR DG CHEST 2V
2 series · 2 of 2 positions shown · non-contrast
Comparison: November 27, 2010

CLINICAL DATA: Chest pain, difficulty breathing, cough, fever,
congestion, sore throat. Recent flu exposure.

EXAM:
CHEST - 2 VIEW

[chest lat]
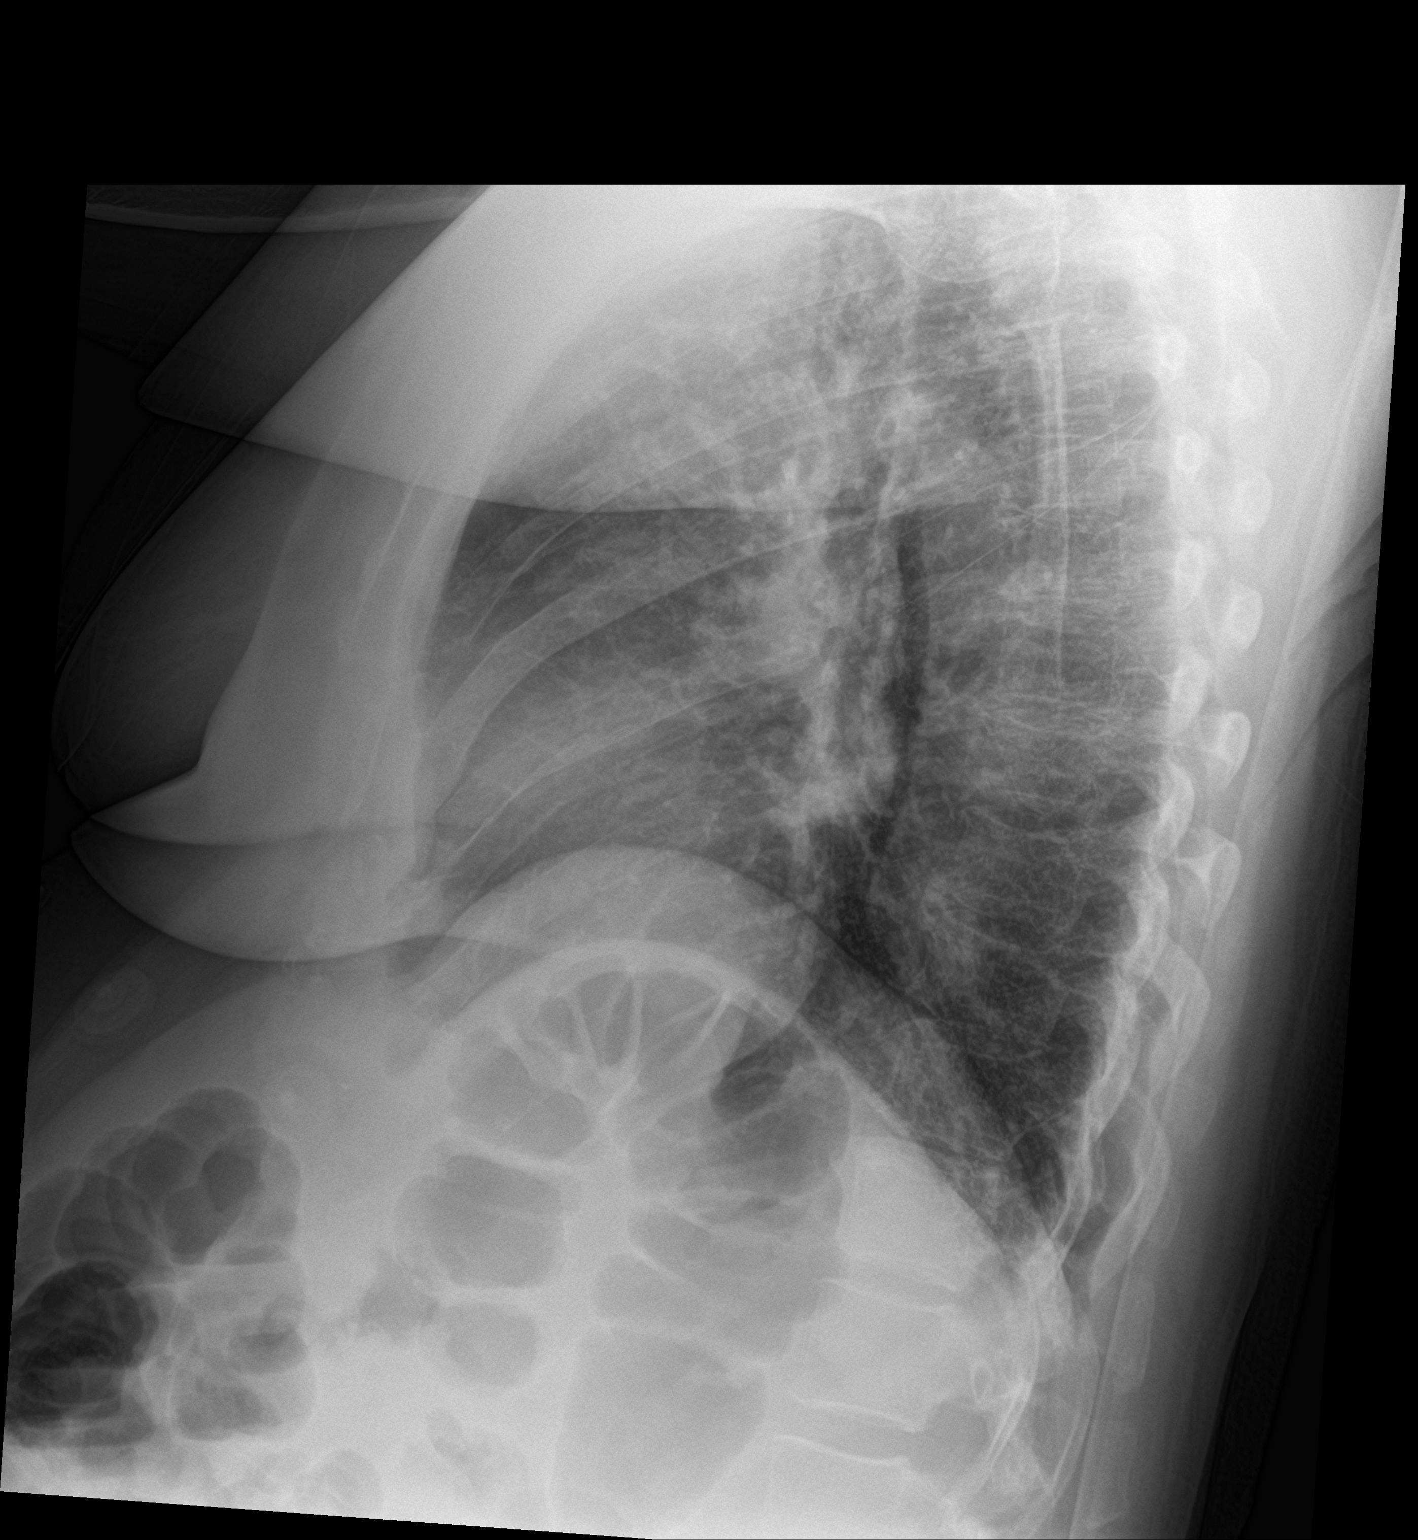

[chest ap]
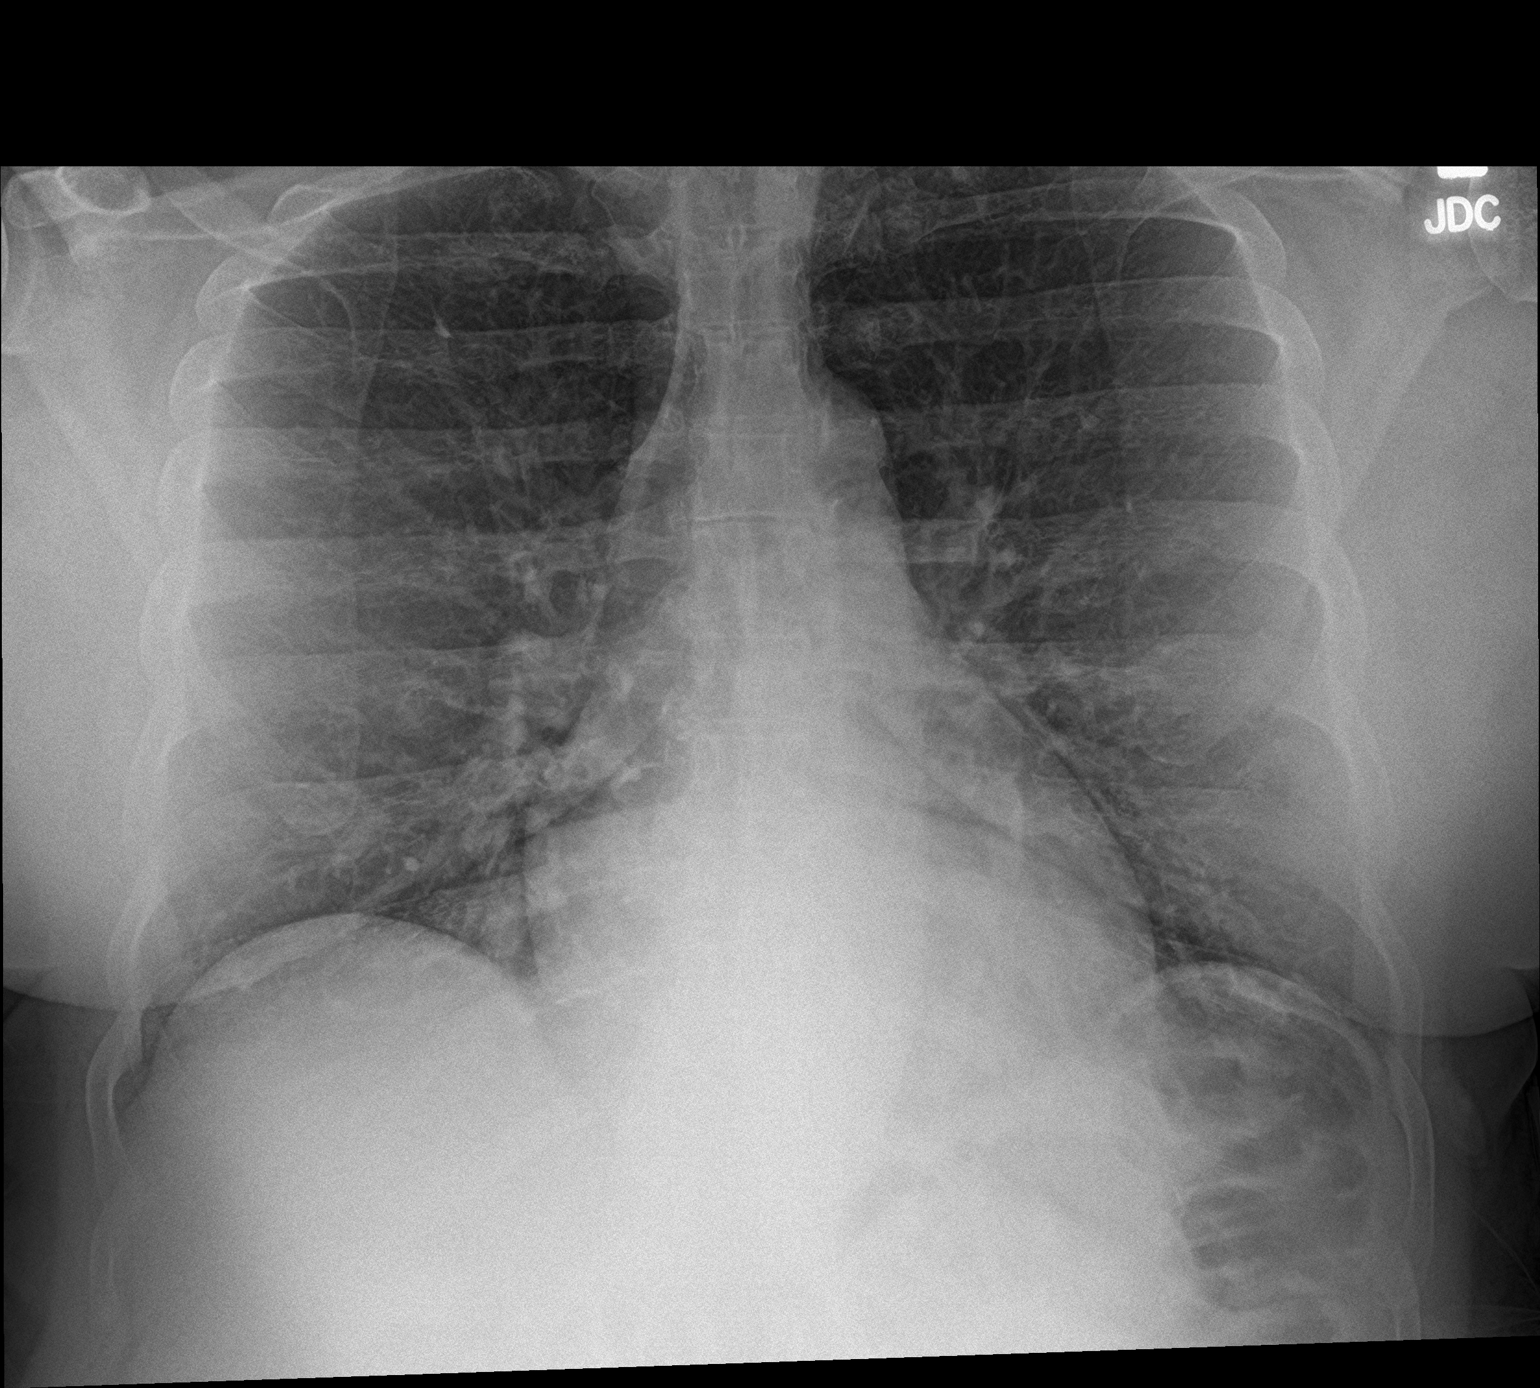

[2 of 2 positions shown; findings below may reference images not displayed]

FINDINGS: The heart size and mediastinal contours are within normal limits.
Perihilar predominant bronchial wall thickening/interstitial
opacities suggesting viral process. No focal consolidation. No
pleural effusion. No pneumothorax. The visualized skeletal
structures are unremarkable.
IMPRESSION: Perihilar predominant bronchial wall thickening/interstitial
opacities suggesting viral process. No focal consolidation.

## 2023-10-23 IMAGING — CR DG CHEST 2V
2 series · 2 of 2 positions shown · non-contrast
Comparison: Chest x-ray 10/04/2021.

CLINICAL DATA: 55-year-old female with history of chest pain,
shortness of breath, fevers, chills, body aches and dry cough with
sore throat for the past 4 days.

EXAM:
CHEST - 2 VIEW

[chest pa]
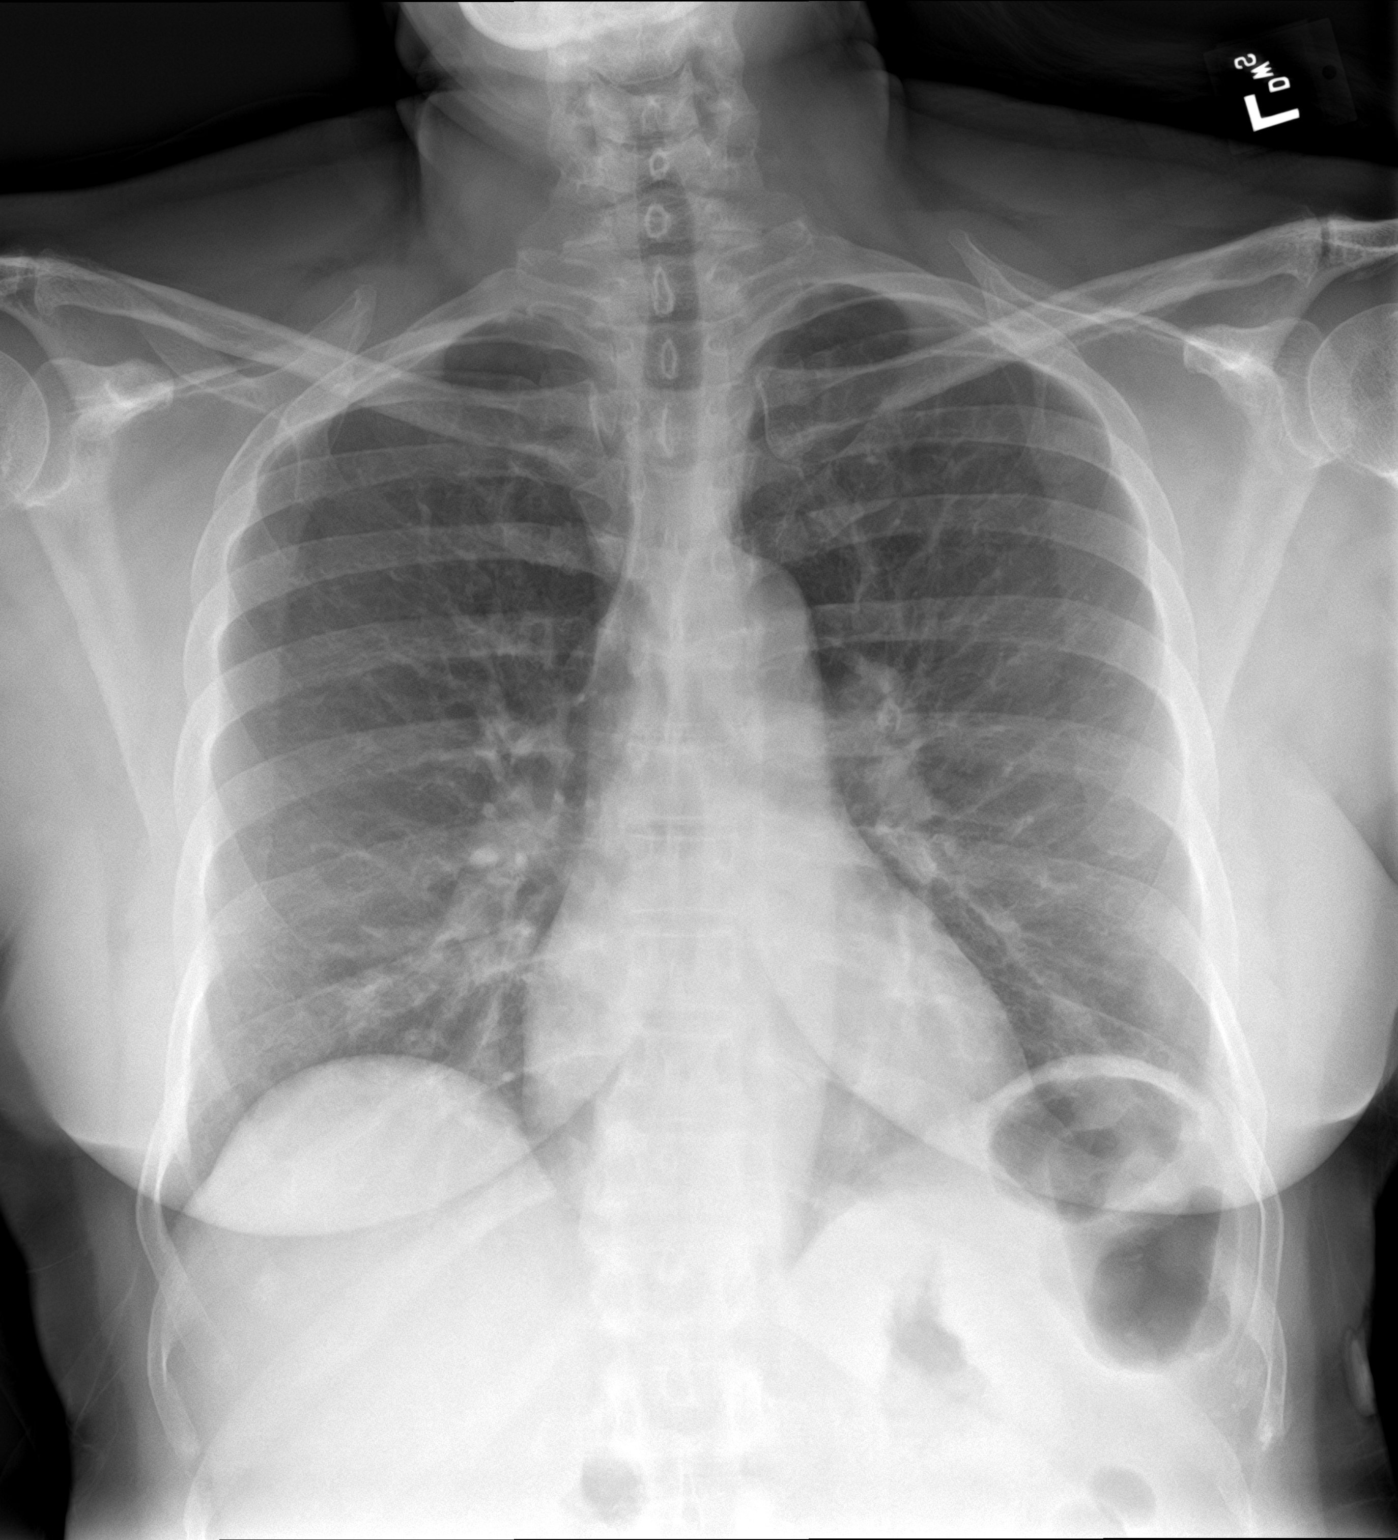

[chest lat]
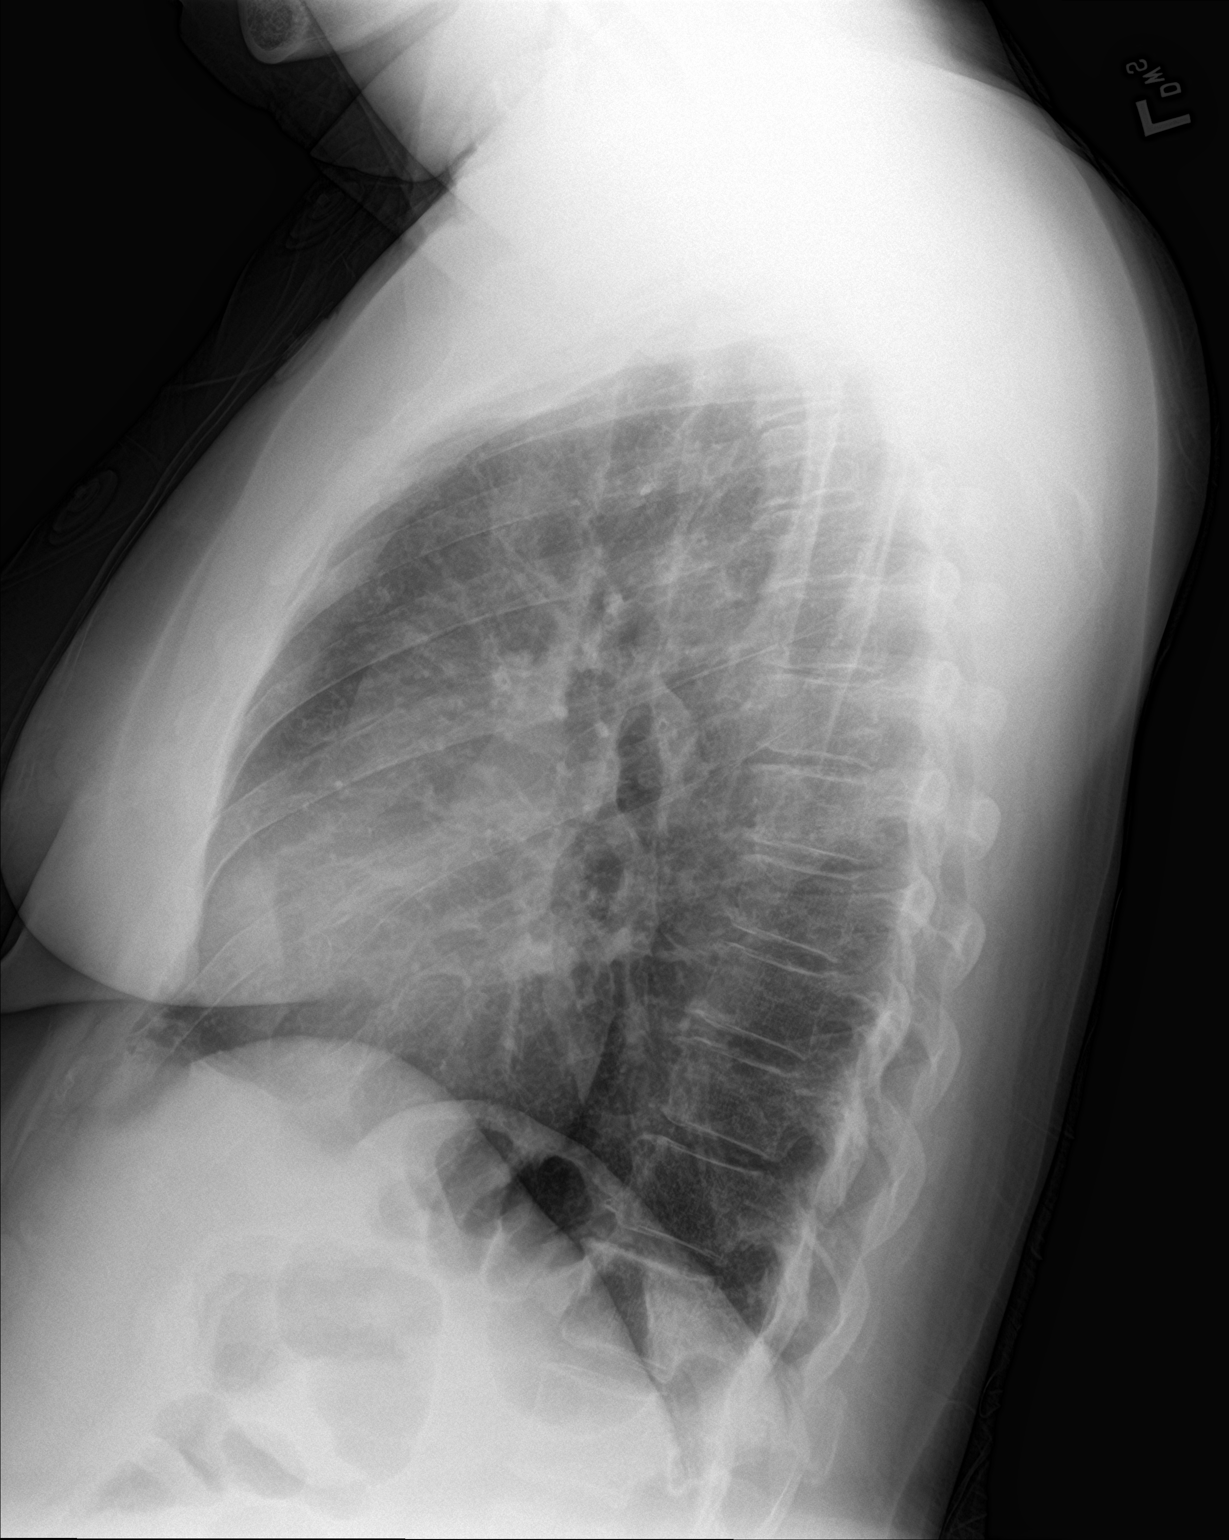

[2 of 2 positions shown; findings below may reference images not displayed]

FINDINGS: Lung volumes are normal. No consolidative airspace disease. No
pleural effusions. No pneumothorax. No pulmonary nodule or mass
noted. Pulmonary vasculature and the cardiomediastinal silhouette
are within normal limits.
IMPRESSION: No radiographic evidence of acute cardiopulmonary disease.
